# Patient Record
Sex: Female | Born: 1980 | Race: Asian | Hispanic: No | Marital: Married | Smoking: Former smoker
Health system: Southern US, Community
[De-identification: ages and names within clinical notes are randomized; demographics above are authoritative.]

## PROBLEM LIST (undated history)

## (undated) DIAGNOSIS — H6092 Unspecified otitis externa, left ear: Secondary | ICD-10-CM

## (undated) DIAGNOSIS — F418 Other specified anxiety disorders: Secondary | ICD-10-CM

## (undated) DIAGNOSIS — F32A Depression, unspecified: Secondary | ICD-10-CM

## (undated) DIAGNOSIS — H9319 Tinnitus, unspecified ear: Secondary | ICD-10-CM

## (undated) DIAGNOSIS — N6019 Diffuse cystic mastopathy of unspecified breast: Secondary | ICD-10-CM

## (undated) DIAGNOSIS — Z0000 Encounter for general adult medical examination without abnormal findings: Secondary | ICD-10-CM

## (undated) DIAGNOSIS — M25562 Pain in left knee: Secondary | ICD-10-CM

## (undated) DIAGNOSIS — F329 Major depressive disorder, single episode, unspecified: Secondary | ICD-10-CM

## (undated) DIAGNOSIS — R51 Headache: Secondary | ICD-10-CM

## (undated) DIAGNOSIS — Z9189 Other specified personal risk factors, not elsewhere classified: Secondary | ICD-10-CM

## (undated) DIAGNOSIS — B09 Unspecified viral infection characterized by skin and mucous membrane lesions: Principal | ICD-10-CM

## (undated) HISTORY — PX: APPENDECTOMY: SHX54

## (undated) HISTORY — DX: Other specified personal risk factors, not elsewhere classified: Z91.89

## (undated) HISTORY — PX: BREAST SURGERY: SHX581

## (undated) HISTORY — DX: Major depressive disorder, single episode, unspecified: F32.9

## (undated) HISTORY — DX: Other specified anxiety disorders: F41.8

## (undated) HISTORY — DX: Tinnitus, unspecified ear: H93.19

## (undated) HISTORY — DX: Pain in left knee: M25.562

## (undated) HISTORY — DX: Diffuse cystic mastopathy of unspecified breast: N60.19

## (undated) HISTORY — DX: Depression, unspecified: F32.A

## (undated) HISTORY — DX: Unspecified viral infection characterized by skin and mucous membrane lesions: B09

## (undated) HISTORY — DX: Unspecified otitis externa, left ear: H60.92

## (undated) HISTORY — DX: Encounter for general adult medical examination without abnormal findings: Z00.00

## (undated) HISTORY — DX: Headache: R51

---

## 2007-01-07 LAB — HM MAMMOGRAPHY

## 2007-09-01 ENCOUNTER — Emergency Department (HOSPITAL_COMMUNITY): Admission: EM | Admit: 2007-09-01 | Discharge: 2007-09-01 | Payer: Self-pay | Admitting: Emergency Medicine

## 2008-02-03 ENCOUNTER — Encounter: Payer: Self-pay | Admitting: Obstetrics and Gynecology

## 2008-02-03 ENCOUNTER — Ambulatory Visit: Payer: Self-pay | Admitting: Obstetrics and Gynecology

## 2008-02-04 ENCOUNTER — Encounter: Payer: Self-pay | Admitting: Obstetrics and Gynecology

## 2008-05-22 ENCOUNTER — Encounter: Admission: RE | Admit: 2008-05-22 | Discharge: 2008-05-22 | Payer: Self-pay | Admitting: Family Medicine

## 2009-01-06 LAB — HM PAP SMEAR

## 2010-04-22 LAB — POCT URINALYSIS DIP (DEVICE)
Bilirubin Urine: NEGATIVE
Glucose, UA: NEGATIVE mg/dL
Ketones, ur: NEGATIVE mg/dL
Specific Gravity, Urine: 1.015 (ref 1.005–1.030)
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
## 2011-03-12 ENCOUNTER — Emergency Department (HOSPITAL_COMMUNITY): Payer: PRIVATE HEALTH INSURANCE

## 2011-03-12 ENCOUNTER — Encounter (HOSPITAL_COMMUNITY): Payer: Self-pay | Admitting: *Deleted

## 2011-03-12 ENCOUNTER — Emergency Department (HOSPITAL_COMMUNITY)
Admission: EM | Admit: 2011-03-12 | Discharge: 2011-03-12 | Disposition: A | Discharge status: Home / Self Care | Payer: PRIVATE HEALTH INSURANCE | Attending: Emergency Medicine | Admitting: Emergency Medicine

## 2011-03-12 DIAGNOSIS — M79609 Pain in unspecified limb: Secondary | ICD-10-CM | POA: Insufficient documentation

## 2011-03-12 DIAGNOSIS — T148XXA Other injury of unspecified body region, initial encounter: Secondary | ICD-10-CM | POA: Insufficient documentation

## 2011-03-12 DIAGNOSIS — M25569 Pain in unspecified knee: Secondary | ICD-10-CM | POA: Insufficient documentation

## 2011-03-12 DIAGNOSIS — R51 Headache: Secondary | ICD-10-CM | POA: Insufficient documentation

## 2011-03-12 DIAGNOSIS — M542 Cervicalgia: Secondary | ICD-10-CM | POA: Insufficient documentation

## 2011-03-12 DIAGNOSIS — M546 Pain in thoracic spine: Secondary | ICD-10-CM | POA: Insufficient documentation

## 2011-03-12 DIAGNOSIS — R109 Unspecified abdominal pain: Secondary | ICD-10-CM | POA: Insufficient documentation

## 2011-03-12 LAB — POCT I-STAT, CHEM 8
BUN: 13 mg/dL (ref 6–23)
Creatinine, Ser: 0.6 mg/dL (ref 0.50–1.10)
Glucose, Bld: 107 mg/dL — ABNORMAL HIGH (ref 70–99)
Potassium: 4.4 mEq/L (ref 3.5–5.1)
Sodium: 138 mEq/L (ref 135–145)

## 2011-03-12 LAB — COMPREHENSIVE METABOLIC PANEL
ALT: 31 U/L (ref 0–35)
AST: 26 U/L (ref 0–37)
Alkaline Phosphatase: 47 U/L (ref 39–117)
CO2: 24 mEq/L (ref 19–32)
Calcium: 10.1 mg/dL (ref 8.4–10.5)
GFR calc Af Amer: >90 mL/min (ref >90)
GFR calc non Af Amer: >90 mL/min (ref >90)
Glucose, Bld: 103 mg/dL — ABNORMAL HIGH (ref 70–99)
Potassium: 4.3 mEq/L (ref 3.5–5.1)
Sodium: 136 mEq/L (ref 135–145)
Total Protein: 7.7 g/dL (ref 6.0–8.3)

## 2011-03-12 LAB — CBC
HCT: 40.4 % (ref 36.0–46.0)
Hemoglobin: 14 g/dL (ref 12.0–15.0)
MCH: 29.9 pg (ref 26.0–34.0)
MCHC: 34.7 g/dL (ref 30.0–36.0)
RDW: 13 % (ref 11.5–15.5)

## 2011-03-12 LAB — PROTIME-INR: INR: 0.92 (ref 0.00–1.49)

## 2011-03-12 MED ORDER — NAPROXEN 500 MG PO TABS: 500.0000 mg | ORAL_TABLET | Freq: Two times a day (BID) | ORAL | Status: DC

## 2011-03-12 MED ORDER — ONDANSETRON HCL 4 MG/2ML IJ SOLN
4.0000 mg | Freq: Once | INTRAMUSCULAR | Status: AC
  Administered 2011-03-12: 4 mg via INTRAVENOUS
  Filled 2011-03-12: qty 2

## 2011-03-12 MED ORDER — MORPHINE SULFATE 4 MG/ML IJ SOLN
4.0000 mg | Freq: Once | INTRAMUSCULAR | Status: AC
  Administered 2011-03-12: 4 mg via INTRAVENOUS
  Filled 2011-03-12: qty 1

## 2011-03-12 MED ORDER — ONDANSETRON 8 MG PO TBDP: 8.0000 mg | ORAL_TABLET | Freq: Three times a day (TID) | ORAL | Status: AC | PRN

## 2011-03-12 MED ORDER — HYDROCODONE-ACETAMINOPHEN 5-325 MG PO TABS: 1.0000 | ORAL_TABLET | Freq: Four times a day (QID) | ORAL | Status: AC | PRN

## 2011-03-12 MED ORDER — IOHEXOL 300 MG/ML  SOLN
80.0000 mL | Freq: Once | INTRAMUSCULAR | Status: AC | PRN
  Administered 2011-03-12: 80 mL via INTRAVENOUS

## 2011-03-12 MED ORDER — METOCLOPRAMIDE HCL 5 MG/ML IJ SOLN
10.0000 mg | Freq: Once | INTRAMUSCULAR | Status: AC
  Administered 2011-03-12: 10 mg via INTRAVENOUS
  Filled 2011-03-12: qty 2

## 2011-03-12 MED ORDER — CYCLOBENZAPRINE HCL 5 MG PO TABS: 5.0000 mg | ORAL_TABLET | Freq: Three times a day (TID) | ORAL | Status: AC | PRN
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
## 2011-03-12 NOTE — Progress Notes (Signed)
Orthopedic Tech Progress Note Patient Details:  Marcia Foley 15-Apr-1980 [id number]  Other Ortho Devices Type of Ortho Device: Crutches Ortho Device Interventions: Application   Marcia Foley 03/12/2011, 1:08 PM

## 2011-03-12 NOTE — ED Notes (Signed)
Patient transported to X-ray 
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
## 2011-03-12 NOTE — ED Notes (Signed)
Pt stated she was in pain again and very nauseous and needed alcohol on her nose. Gave pt alcohol pad per her request and pt grasped pad in fingers. Pt then stated "I can't move my arms." Held pt arm up and pt kept arm in air, no loss of upper extremity function noted. Pt informed and then closed eyes very tightly and refused to speak to this RN. MD notified of pt nausea and pain.

## 2011-05-16 ENCOUNTER — Encounter (HOSPITAL_COMMUNITY): Payer: Self-pay | Admitting: Emergency Medicine

## 2011-05-16 ENCOUNTER — Inpatient Hospital Stay (HOSPITAL_COMMUNITY)
Admission: EM | Admit: 2011-05-16 | Discharge: 2011-05-18 | DRG: 918 | Disposition: A | Discharge status: Home / Self Care | Payer: No Typology Code available for payment source | Source: Ambulatory Visit | Attending: Surgery | Admitting: Surgery

## 2011-05-16 DIAGNOSIS — Y998 Other external cause status: Secondary | ICD-10-CM

## 2011-05-16 DIAGNOSIS — T63121A Toxic effect of venom of other venomous lizard, accidental (unintentional), initial encounter: Secondary | ICD-10-CM | POA: Diagnosis present

## 2011-05-16 DIAGNOSIS — T63004A Toxic effect of unspecified snake venom, undetermined, initial encounter: Secondary | ICD-10-CM

## 2011-05-16 DIAGNOSIS — T63001A Toxic effect of unspecified snake venom, accidental (unintentional), initial encounter: Secondary | ICD-10-CM | POA: Diagnosis present

## 2011-05-16 DIAGNOSIS — T6391XA Toxic effect of contact with unspecified venomous animal, accidental (unintentional), initial encounter: Principal | ICD-10-CM | POA: Diagnosis present

## 2011-05-16 LAB — CBC
HCT: 33.4 % — ABNORMAL LOW (ref 36.0–46.0)
Hemoglobin: 11.3 g/dL — ABNORMAL LOW (ref 12.0–15.0)
MCH: 29.6 pg (ref 26.0–34.0)
MCHC: 33.8 g/dL (ref 30.0–36.0)
MCV: 87.4 fL (ref 78.0–100.0)

## 2011-05-16 LAB — BASIC METABOLIC PANEL
BUN: 15 mg/dL (ref 6–23)
CO2: 23 mEq/L (ref 19–32)
Chloride: 107 mEq/L (ref 96–112)
Creatinine, Ser: 0.66 mg/dL (ref 0.50–1.10)
Glucose, Bld: 100 mg/dL — ABNORMAL HIGH (ref 70–99)

## 2011-05-16 MED ORDER — SODIUM CHLORIDE 0.9 % IV SOLN
4.0000 | Freq: Once | INTRAVENOUS | Status: AC
  Administered 2011-05-17: 40 mL via INTRAVENOUS
  Filled 2011-05-16: qty 40

## 2011-05-16 MED ORDER — KETOROLAC TROMETHAMINE 30 MG/ML IJ SOLN
30.0000 mg | Freq: Once | INTRAMUSCULAR | Status: DC
  Administered 2011-05-16: 30 mg via INTRAVENOUS
  Filled 2011-05-16: qty 1

## 2011-05-16 MED ORDER — TETANUS-DIPHTH-ACELL PERTUSSIS 5-2.5-18.5 LF-MCG/0.5 IM SUSP
0.5000 mL | Freq: Once | INTRAMUSCULAR | Status: AC
  Administered 2011-05-16: 0.5 mL via INTRAMUSCULAR
  Filled 2011-05-16: qty 0.5

## 2011-05-16 MED ORDER — ONDANSETRON HCL 4 MG/2ML IJ SOLN
4.0000 mg | Freq: Once | INTRAMUSCULAR | Status: AC
  Administered 2011-05-16: 4 mg via INTRAVENOUS
  Filled 2011-05-16: qty 2

## 2011-05-16 MED ORDER — CROTALIDAE POLYVAL IMMUNE FAB IV SOLR
4.0000 | Freq: Once | INTRAVENOUS | Status: DC
  Filled 2011-05-16: qty 40

## 2011-05-16 MED ORDER — HYDROMORPHONE HCL PF 1 MG/ML IJ SOLN
1.0000 mg | Freq: Once | INTRAMUSCULAR | Status: AC
  Administered 2011-05-16: 1 mg via INTRAVENOUS
  Filled 2011-05-16: qty 1

## 2011-05-16 MED ORDER — SODIUM CHLORIDE 0.9 % IV BOLUS (SEPSIS)
1000.0000 mL | Freq: Once | INTRAVENOUS | Status: AC
  Administered 2011-05-16: 1000 mL via INTRAVENOUS

## 2011-05-16 NOTE — ED Provider Notes (Signed)
31 year old female was bitten on the right foot by a snake. She killed the snake and brought it in with her. It does appear to be a small copperhead. She is complaining of pain and swelling in the right foot and some nausea. Exam shows some ecchymosis and very slight swelling around 2 small puncture wounds on the lateral aspect of the right midfoot. At this time, she does not show any signs of systemic reaction and does not have any indications for using CroFab.  Dione Booze, MD 05/16/11 2221

## 2011-05-16 NOTE — ED Notes (Signed)
PHARMACY NOTIFIED BY RN TO SEND PT'S CROFAB ORDER.
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
## 2011-05-17 ENCOUNTER — Encounter (HOSPITAL_COMMUNITY): Payer: Self-pay | Admitting: Surgery

## 2011-05-17 DIAGNOSIS — T6391XA Toxic effect of contact with unspecified venomous animal, accidental (unintentional), initial encounter: Secondary | ICD-10-CM

## 2011-05-17 LAB — COMPREHENSIVE METABOLIC PANEL
Alkaline Phosphatase: 51 U/L (ref 39–117)
BUN: 12 mg/dL (ref 6–23)
CO2: 19 mEq/L (ref 19–32)
Chloride: 104 mEq/L (ref 96–112)
Creatinine, Ser: 0.58 mg/dL (ref 0.50–1.10)
GFR calc non Af Amer: >90 mL/min (ref >90)
Total Bilirubin: 0.3 mg/dL (ref 0.3–1.2)

## 2011-05-17 LAB — APTT: aPTT: 24 seconds (ref 24–37)

## 2011-05-17 LAB — CBC
MCH: 29.4 pg (ref 26.0–34.0)
Platelets: 361 10*3/uL (ref 150–400)
RBC: 4.25 MIL/uL (ref 3.87–5.11)
RDW: 12.9 % (ref 11.5–15.5)
WBC: 13.1 10*3/uL — ABNORMAL HIGH (ref 4.0–10.5)

## 2011-05-17 LAB — MRSA PCR SCREENING: MRSA by PCR: NEGATIVE

## 2011-05-17 LAB — PROTIME-INR: INR: 0.93 (ref 0.00–1.49)

## 2011-05-17 MED ORDER — ENOXAPARIN SODIUM 40 MG/0.4ML ~~LOC~~ SOLN
40.0000 mg | SUBCUTANEOUS | Status: DC
  Administered 2011-05-18: 40 mg via SUBCUTANEOUS
  Filled 2011-05-17: qty 0.4

## 2011-05-17 MED ORDER — LORAZEPAM 2 MG/ML IJ SOLN: 1.0000 mg | Freq: Four times a day (QID) | INTRAMUSCULAR | Status: DC | PRN

## 2011-05-17 MED ORDER — DEXTROSE IN LACTATED RINGERS 5 % IV SOLN
INTRAVENOUS | Status: DC
  Administered 2011-05-17 (×2): via INTRAVENOUS

## 2011-05-17 MED ORDER — OXYCODONE HCL 5 MG PO TABS
5.0000 mg | ORAL_TABLET | ORAL | Status: DC | PRN
  Administered 2011-05-17 – 2011-05-18 (×4): 5 mg via ORAL
  Filled 2011-05-17 (×4): qty 1

## 2011-05-17 MED ORDER — ONDANSETRON HCL 4 MG/2ML IJ SOLN
4.0000 mg | Freq: Four times a day (QID) | INTRAMUSCULAR | Status: DC | PRN
  Administered 2011-05-17 (×2): 4 mg via INTRAVENOUS
  Filled 2011-05-17 (×2): qty 2

## 2011-05-17 MED ORDER — HYDROMORPHONE HCL PF 1 MG/ML IJ SOLN
1.0000 mg | INTRAMUSCULAR | Status: DC | PRN
  Administered 2011-05-17 (×3): 1 mg via INTRAVENOUS
  Filled 2011-05-17 (×3): qty 1

## 2011-05-17 MED ORDER — PROMETHAZINE HCL 25 MG/ML IJ SOLN: 12.5000 mg | Freq: Four times a day (QID) | INTRAMUSCULAR | Status: DC | PRN

## 2011-05-17 NOTE — ED Notes (Signed)
CALLED FLOOR TO GIVE REPORT NURSE NOT READY AT THIS TIME.

## 2011-05-17 NOTE — Progress Notes (Signed)
Trauma Service Note  Subjective: Patient complaining of more swelling, more pain, nausea, and dizziness.  The nausea and dizziness may be secondary to the Crofab injection.  By report she has received just mone anti-venom  Objective: Vital signs in last 24 hours: Temp:  [97.7 F (36.5 C)-98.4 F (36.9 C)] 97.7 F (36.5 C) (05/11 1145) Pulse Rate:  [77-132] 77  (05/11 1145) Resp:  [8-20] 12  (05/11 1145) BP: (105-152)/(1-91) 117/79 mmHg (05/11 1145) SpO2:  [99 %-100 %] 100 % (05/11 1145) Weight:  [60.782 kg (134 lb)] 60.782 kg (134 lb) (05/10 2212) Last BM Date: 05/17/11  Intake/Output from previous day: 05/10 0701 - 05/11 0700 In: 600 [I.V.:600] Out: 5 [Urine:4; Stool:1] Intake/Output this shift: Total I/O In: 600 [I.V.:600] Out: 6 [Urine:4; Emesis/NG output:2]  General: Not acute distress  Lungs: Clear  Abd: Benign  Extremities: Ecchymotic swell laterally at the right ankle, just onto the lower leg, soft, definite no compartment syndrome  Neuro: Intact.  Normal function of RLE  Lab Results: CBC   Basename 05/17/11 0420 05/16/11 2222  WBC 13.1* 6.9  HGB 12.5 11.3*  HCT 36.7 33.4*  PLT 361 323   BMET  Basename 05/17/11 0420 05/16/11 2222  NA 136 141  K 4.0 4.2  CL 104 107  CO2 19 23  GLUCOSE 112* 100*  BUN 12 15  CREATININE 0.58 0.66  CALCIUM 8.1* 8.4   PT/INR  Basename 05/17/11 0420 05/16/11 2222  LABPROT 12.7 12.4  INR 0.93 0.91   ABG No results found for this basename: PHART:2,PCO2:2,PO2:2,HCO3:2 in the last 72 hours  Studies/Results: No results found.  Anti-infectives: Anti-infectives    None      Assessment/Plan: s/p  Advance diet Transfer to floor. Keep at bedrest, keep leg elevated today, ambulate tomorrow.  LOS: 1 day   Marta Lamas. Gae Bon, MD, FACS [phone] Trauma Surgeon 05/17/2011

## 2011-05-17 NOTE — ED Notes (Signed)
PT. STILL WAITING FOR IV CROFAB MEDICATION FROM PHARMACY.

## 2011-05-17 NOTE — H&P (Signed)
Marcia Foley is an 31 y.o. female.   Chief Complaint: SNAKE BITE RIGHT ANKLE HPI: Asked to see pt at request of Dr Maisie Fus due to snake bite right ankle tonight. Snake positively identified as a copper head probably juvenile. She complains of nausea,  Vomiting,  diarrhea and ankle and abdominal pain.  History reviewed. No pertinent past medical history.  History reviewed. No pertinent past surgical history.  No family history on file. Social History:  reports that she has never smoked. She does not have any smokeless tobacco history on file. She reports that she does not drink alcohol or use illicit drugs.  Allergies:  Allergies  Allergen Reactions  . Other     Steroids = keeps her awake     (Not in a hospital admission)  Results for orders placed during the hospital encounter of 05/16/11 (from the past 48 hour(s))  CBC     Status: Abnormal   Collection Time   05/16/11 10:22 PM      Component Value Range Comment   WBC 6.9  4.0 - 10.5 (K/uL)    RBC 3.82 (*) 3.87 - 5.11 (MIL/uL)    Hemoglobin 11.3 (*) 12.0 - 15.0 (g/dL)    HCT 16.1 (*) 09.6 - 46.0 (%)    MCV 87.4  78.0 - 100.0 (fL)    MCH 29.6  26.0 - 34.0 (pg)    MCHC 33.8  30.0 - 36.0 (g/dL)    RDW 04.5  40.9 - 81.1 (%)    Platelets 323  150 - 400 (K/uL)   PROTIME-INR     Status: Normal   Collection Time   05/16/11 10:22 PM      Component Value Range Comment   Prothrombin Time 12.4  11.6 - 15.2 (seconds)    INR 0.91  0.00 - 1.49    BASIC METABOLIC PANEL     Status: Abnormal   Collection Time   05/16/11 10:22 PM      Component Value Range Comment   Sodium 141  135 - 145 (mEq/L)    Potassium 4.2  3.5 - 5.1 (mEq/L)    Chloride 107  96 - 112 (mEq/L)    CO2 23  19 - 32 (mEq/L)    Glucose, Bld 100 (*) 70 - 99 (mg/dL)    BUN 15  6 - 23 (mg/dL)    Creatinine, Ser 9.14  0.50 - 1.10 (mg/dL)    Calcium 8.4  8.4 - 10.5 (mg/dL)    GFR calc non Af Amer >90  >90 (mL/min)    GFR calc Af Amer >90  >90 (mL/min)   FIBRINOGEN      Status: Abnormal   Collection Time   05/16/11 10:22 PM      Component Value Range Comment   Fibrinogen 161 (*) 204 - 475 (mg/dL)    No results found.  Review of Systems  Constitutional: Positive for malaise/fatigue and diaphoresis.  HENT: Negative.   Eyes: Negative.   Respiratory: Negative.   Cardiovascular: Negative.   Gastrointestinal: Positive for nausea, vomiting, abdominal pain and diarrhea.  Genitourinary: Negative.   Musculoskeletal: Positive for myalgias.  Skin: Negative.   Neurological: Positive for weakness.  Endo/Heme/Allergies: Negative.   Psychiatric/Behavioral: Negative.     Blood pressure 132/91, pulse 112, temperature 98.4 F (36.9 C), temperature source Oral, resp. rate 16, weight 134 lb (60.782 kg), last menstrual period 05/14/2011, SpO2 100.00%. Physical Exam  Constitutional: She is oriented to person, place, and time. She appears well-developed and well-nourished. She  appears distressed.  HENT:  Head: Normocephalic and atraumatic.  Eyes: EOM are normal. Pupils are equal, round, and reactive to light.  Neck: Normal range of motion. Neck supple.  Cardiovascular: Normal rate and regular rhythm.  Exam reveals no gallop and no friction rub.   No murmur heard. Respiratory: Effort normal and breath sounds normal.  GI: Soft. Bowel sounds are normal. There is tenderness.  Musculoskeletal: Normal range of motion.  Neurological: She is alert and oriented to person, place, and time.  Skin:        Assessment/Plan Snake bite right ankle by verified cooper head with systemic sign of envenomation Cro FAB ordered by EDP which is reasonable since she is symptomatic Step down bed. Monitor coag and fibrinogen Treat symptoms. monitor  Senai Kingsley A. 05/17/2011, 12:11 AM

## 2011-05-17 NOTE — ED Notes (Signed)
STILL WAITING FOR PT'S. CROFAB MEDICATION IV FROM PHARMACY.

## 2011-05-18 MED ORDER — CEPHALEXIN 500 MG PO CAPS
500.0000 mg | ORAL_CAPSULE | Freq: Three times a day (TID) | ORAL | Status: DC
  Administered 2011-05-18: 500 mg via ORAL
  Filled 2011-05-18 (×3): qty 1

## 2011-05-18 MED ORDER — CEPHALEXIN 500 MG PO CAPS: 500.0000 mg | ORAL_CAPSULE | Freq: Three times a day (TID) | ORAL | Status: AC

## 2011-05-18 MED ORDER — OXYCODONE HCL 5 MG PO TABS: 5.0000 mg | ORAL_TABLET | Freq: Four times a day (QID) | ORAL | Status: DC | PRN

## 2011-05-18 NOTE — Progress Notes (Signed)
Discharge instructions gone over with patient. Prescription for pain given, other for antibiotic faxed to [facility] on [address]. Follow up with trauma doctor if needed. Home medications gone over. Told to be sure to complete antibiotics. Patient verbalized understanding of all. Copy of instructions given to patient.

## 2011-05-18 NOTE — Progress Notes (Signed)
  Subjective: Feels better.  Sore right ankle.  Objective: Vital signs in last 24 hours: Temp:  [97.4 F (36.3 C)-99.3 F (37.4 C)] 97.4 F (36.3 C) (05/12 1000) Pulse Rate:  [73-93] 83  (05/12 1000) Resp:  [12-18] 18  (05/12 1000) BP: (98-117)/(63-79) 110/69 mmHg (05/12 1000) SpO2:  [99 %-100 %] 100 % (05/12 1000) Last BM Date: 05/17/11  Intake/Output from previous day: 05/11 0701 - 05/12 0700 In: 1320 [P.O.:720; I.V.:600] Out: 300 [Urine:300] Intake/Output this shift: Total I/O In: 240 [P.O.:240] Out: -   General appearance: alert and cooperative Resp: clear to auscultation bilaterally GI: soft, non-tender; bowel sounds normal; no masses,  no organomegaly Extremities: edema to rle less.  soft compartments.  no redness or fluctuance or tissue loss and   Lab Results:   [facility] 05/17/11 0420 05/16/11 2222  WBC 13.1* 6.9  HGB 12.5 11.3*  HCT 36.7 33.4*  PLT 361 323   BMET  Basename 05/17/11 0420 05/16/11 2222  NA 136 141  K 4.0 4.2  CL 104 107  CO2 19 23  GLUCOSE 112* 100*  BUN 12 15  CREATININE 0.58 0.66  CALCIUM 8.1* 8.4   PT/INR  Basename 05/17/11 0420 05/16/11 2222  LABPROT 12.7 12.4  INR 0.93 0.91   ABG No results found for this basename: PHART:2,PCO2:2,PO2:2,HCO3:2 in the last 72 hours  Studies/Results: No results found.  Anti-infectives: Anti-infectives    None      Assessment/Plan: Snake bite with envenomation  Improved with no further progression Antibiotics for 7 days Follow up trauma clinic.  Discharge  LOS: 2 days    Corinthian Mizrahi A. 05/18/2011

## 2011-05-18 NOTE — Discharge Summary (Signed)
Physician Discharge Summary  Patient ID: Marcia Foley MRN: [id number] DOB/AGE: October 19, 1980 31 y.o.  Admit date: 05/16/2011 Discharge date: 05/18/2011  Admission Diagnoses:  Snake envenomation right ankle  Discharge Diagnoses: same Active Problems:  * No active hospital problems. *    Discharged Condition: good  Hospital Course: Pt admitted 2 days ago after being bitten by a copperhead juvenile snake.  She received cryofab antivenom due to systemic signs.  Admited and followed.  Pain better on day one and diet advanced.   Swelling better on day 2 and she had good pain control with improvement in systemic symptoms.  Vital signs stable.  Right leg less swollen and she had soft compartments and no redness or fluctuance.  She could ambulate with assistance.  She was discharge home at this point.  Labs showed slight increase in WBC but no fever or chills.  The remaining blood work clinically acceptable.   Consults: None  Significant Diagnostic Studies: labs: see lab section  Treatments: cryofab antivenom  Discharge Exam: Blood pressure 110/69, pulse 83, temperature 97.4 F (36.3 C), temperature source Oral, resp. rate 18, height 5\' 3"  (1.6 m), weight 134 lb (60.782 kg), last menstrual period 05/14/2011, SpO2 100.00%. General appearance: alert and cooperative Resp: clear to auscultation bilaterally GI: soft, non-tender; bowel sounds normal; no masses,  no organomegaly Extremities: edema right ankle swelling much imprived no redness and compartments soft.  Disposition: 01-Home or Self Care  Discharge Orders    Future Orders Please Complete By Expires   Diet - low sodium heart healthy      Increase activity slowly      Driving Restrictions      Comments:   When comfortable.  Off pain meds.   Lifting restrictions      Comments:   As tolerated.   Discharge instructions      Comments:   Call [phone] [facility] for follow up with the Trauma clinic in 7 - 10 days.  OK to  use crutches as needed.  Keflex for 7 days.  Ice packs as needed.     Medication List  As of 05/18/2011 11:05 AM   TAKE these medications         aspirin 325 MG tablet   Take 325 mg by mouth every 4 (four) hours as needed. For pain      cephALEXin 500 MG capsule   Commonly known as: KEFLEX   Take 1 capsule (500 mg total) by mouth every 8 (eight) hours.      ibuprofen 200 MG tablet   Commonly known as: ADVIL,MOTRIN   Take 400 mg by mouth every 6 (six) hours as needed. For pain      oxyCODONE 5 MG immediate release tablet   Commonly known as: Oxy IR/ROXICODONE   Take 1 tablet (5 mg total) by mouth every 6 (six) hours as needed.             Signed: Jayanth Szczesniak A. 05/18/2011, 11:05 AM

## 2011-05-18 NOTE — ED Provider Notes (Signed)
I saw and evaluated the patient, reviewed the resident's note and I agree with the findings and plan. CRITICAL CARE Performed by: Dione Booze   Total critical care time: 40 minutes  Critical care time was exclusive of separately billable procedures and treating other patients.  Critical care was necessary to treat or prevent imminent or life-threatening deterioration.  Critical care was time spent personally by me on the following activities: development of treatment plan with patient and/or surrogate as well as nursing, discussions with consultants, evaluation of patient's response to treatment, examination of patient, obtaining history from patient or surrogate, ordering and performing treatments and interventions, ordering and review of laboratory studies, ordering and review of radiographic studies, pulse oximetry and re-evaluation of patient's condition.   Dione Booze, MD 05/18/11 [phone]

## 2011-05-19 NOTE — Progress Notes (Signed)
UR complete 

## 2011-05-21 ENCOUNTER — Encounter (HOSPITAL_COMMUNITY): Payer: Self-pay | Admitting: *Deleted

## 2011-05-21 ENCOUNTER — Emergency Department (HOSPITAL_COMMUNITY)
Admission: EM | Admit: 2011-05-21 | Discharge: 2011-05-21 | Discharge status: Against Medical Advice | Payer: PRIVATE HEALTH INSURANCE | Attending: Emergency Medicine | Admitting: Emergency Medicine

## 2011-05-21 DIAGNOSIS — R209 Unspecified disturbances of skin sensation: Secondary | ICD-10-CM | POA: Insufficient documentation

## 2011-05-21 DIAGNOSIS — R42 Dizziness and giddiness: Secondary | ICD-10-CM | POA: Insufficient documentation

## 2011-05-21 DIAGNOSIS — R110 Nausea: Secondary | ICD-10-CM | POA: Insufficient documentation

## 2011-05-21 NOTE — ED Notes (Signed)
Pt was d/c'd from [facility] on Sunday after tx for copper head bite.  When pt was discharged, she felt nauseated, dizzy and numb at the site, but today these symptoms became unbearable. AO x 4.

## 2011-06-24 ENCOUNTER — Ambulatory Visit (INDEPENDENT_AMBULATORY_CARE_PROVIDER_SITE_OTHER): Payer: PRIVATE HEALTH INSURANCE | Admitting: Family Medicine

## 2011-06-24 ENCOUNTER — Encounter: Payer: Self-pay | Admitting: Family Medicine

## 2011-06-24 VITALS — BP 129/90 | HR 97 | Temp 98.4°F | Ht 63.0 in | Wt 141.4 lb

## 2011-06-24 DIAGNOSIS — F418 Other specified anxiety disorders: Secondary | ICD-10-CM | POA: Insufficient documentation

## 2011-06-24 DIAGNOSIS — R519 Headache, unspecified: Secondary | ICD-10-CM | POA: Insufficient documentation

## 2011-06-24 DIAGNOSIS — R51 Headache: Secondary | ICD-10-CM

## 2011-06-24 DIAGNOSIS — H9319 Tinnitus, unspecified ear: Secondary | ICD-10-CM

## 2011-06-24 DIAGNOSIS — M25569 Pain in unspecified knee: Secondary | ICD-10-CM

## 2011-06-24 DIAGNOSIS — Z9189 Other specified personal risk factors, not elsewhere classified: Secondary | ICD-10-CM

## 2011-06-24 DIAGNOSIS — H6092 Unspecified otitis externa, left ear: Secondary | ICD-10-CM | POA: Insufficient documentation

## 2011-06-24 DIAGNOSIS — F341 Dysthymic disorder: Secondary | ICD-10-CM

## 2011-06-24 DIAGNOSIS — F411 Generalized anxiety disorder: Secondary | ICD-10-CM

## 2011-06-24 DIAGNOSIS — M25562 Pain in left knee: Secondary | ICD-10-CM

## 2011-06-24 DIAGNOSIS — F419 Anxiety disorder, unspecified: Secondary | ICD-10-CM

## 2011-06-24 DIAGNOSIS — N6019 Diffuse cystic mastopathy of unspecified breast: Secondary | ICD-10-CM

## 2011-06-24 HISTORY — DX: Pain in left knee: M25.562

## 2011-06-24 HISTORY — DX: Other specified personal risk factors, not elsewhere classified: Z91.89

## 2011-06-24 MED ORDER — ALPRAZOLAM 0.25 MG PO TABS: 0.2500 mg | ORAL_TABLET | Freq: Two times a day (BID) | ORAL | Status: AC | PRN
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
## 2011-06-24 NOTE — Assessment & Plan Note (Signed)
Cannot discriminate l vs r. Will monitor has only started since her snake bite and increased stressors

## 2011-06-24 NOTE — Assessment & Plan Note (Signed)
No c/o today 

## 2011-06-24 NOTE — Assessment & Plan Note (Signed)
She has taken Alprazolam in the past during a bout of PPD. She feels it worked well and she did not need it frequently. She is given a small amount to use as needed and warned about the risk of addiction and sedation. She is to keep track of her use and she is asked to consider a daily SSRI on return if she is not feeling any better.

## 2011-06-24 NOTE — Assessment & Plan Note (Signed)
Describing tension headaches. Does not drink fluids well, encouraged 64 oz of clear fluids daily, increased exercise, small, frequent healthy meals and reassess at next visit.

## 2011-06-24 NOTE — Assessment & Plan Note (Signed)
Was bit 3 times by a young copperhead snake on her right foot. Spent several days in the hospital and feels she has mostly recovered.

## 2011-06-24 NOTE — Assessment & Plan Note (Signed)
Left knee pain is her only persistent symptom of note. Encouraged her to start [facility] oil caps daily and use Aspercreme prn.

## 2011-06-24 NOTE — Patient Instructions (Addendum)
Preventive Care for Adults, Female A healthy lifestyle and preventive care can promote health and wellness. Preventive health guidelines for women include the following key practices.  A routine yearly physical is a good way to check with your caregiver about your health and preventive screening. It is a chance to share any concerns and updates on your health, and to receive a thorough exam.   Visit your dentist for a routine exam and preventive care every 6 months. Brush your teeth twice a day and floss once a day. Good oral hygiene prevents tooth decay and gum disease.   The frequency of eye exams is based on your age, health, family medical history, use of contact lenses, and other factors. Follow your caregiver's recommendations for frequency of eye exams.   Eat a healthy diet. Foods like vegetables, fruits, whole grains, low-fat dairy products, and lean protein foods contain the nutrients you need without too many calories. Decrease your intake of foods high in solid fats, added sugars, and salt. Eat the right amount of calories for you.Get information about a proper diet from your caregiver, if necessary.   Regular physical exercise is one of the most important things you can do for your health. Most adults should get at least 150 minutes of moderate-intensity exercise (any activity that increases your heart rate and causes you to sweat) each week. In addition, most adults need muscle-strengthening exercises on 2 or more days a week.   Maintain a healthy weight. The body mass index (BMI) is a screening tool to identify possible weight problems. It provides an estimate of body fat based on height and weight. Your caregiver can help determine your BMI, and can help you achieve or maintain a healthy weight.For adults 20 years and older:   A BMI below 18.5 is considered underweight.   A BMI of 18.5 to 24.9 is normal.   A BMI of 25 to 29.9 is considered overweight.   A BMI of 30 and above is  considered obese.   Maintain normal blood lipids and cholesterol levels by exercising and minimizing your intake of saturated fat. Eat a balanced diet with plenty of fruit and vegetables. Blood tests for lipids and cholesterol should begin at age 20 and be repeated every 5 years. If your lipid or cholesterol levels are high, you are over 50, or you are at high risk for heart disease, you may need your cholesterol levels checked more frequently.Ongoing high lipid and cholesterol levels should be treated with medicines if diet and exercise are not effective.   If you smoke, find out from your caregiver how to quit. If you do not use tobacco, do not start.   If you are pregnant, do not drink alcohol. If you are breastfeeding, be very cautious about drinking alcohol. If you are not pregnant and choose to drink alcohol, do not exceed 1 drink per day. One drink is considered to be 12 ounces (355 mL) of beer, 5 ounces (148 mL) of wine, or 1.5 ounces (44 mL) of liquor.   Avoid use of street drugs. Do not share needles with anyone. Ask for help if you need support or instructions about stopping the use of drugs.   High blood pressure causes heart disease and increases the risk of stroke. Your blood pressure should be checked at least every 1 to 2 years. Ongoing high blood pressure should be treated with medicines if weight loss and exercise are not effective.   If you are 55 to 33   years old, ask your caregiver if you should take aspirin to prevent strokes.   Diabetes screening involves taking a blood sample to check your fasting blood sugar level. This should be done once every 3 years, after age 45, if you are within normal weight and without risk factors for diabetes. Testing should be considered at a younger age or be carried out more frequently if you are overweight and have at least 1 risk factor for diabetes.   Breast cancer screening is essential preventive care for women. You should practice "breast  self-awareness." This means understanding the normal appearance and feel of your breasts and may include breast self-examination. Any changes detected, no matter how small, should be reported to a caregiver. Women in their 20s and 30s should have a clinical breast exam (CBE) by a caregiver as part of a regular health exam every 1 to 3 years. After age 40, women should have a CBE every year. Starting at age 40, women should consider having a mammography (breast X-ray test) every year. Women who have a family history of breast cancer should talk to their caregiver about genetic screening. Women at a high risk of breast cancer should talk to their caregivers about having magnetic resonance imaging (MRI) and a mammography every year.   The Pap test is a screening test for cervical cancer. A Pap test can show cell changes on the cervix that might become cervical cancer if left untreated. A Pap test is a procedure in which cells are obtained and examined from the lower end of the uterus (cervix).   Women should have a Pap test starting at age 21.   Between ages 21 and 29, Pap tests should be repeated every 2 years.   Beginning at age 30, you should have a Pap test every 3 years as long as the past 3 Pap tests have been normal.   Some women have medical problems that increase the chance of getting cervical cancer. Talk to your caregiver about these problems. It is especially important to talk to your caregiver if a new problem develops soon after your last Pap test. In these cases, your caregiver may recommend more frequent screening and Pap tests.   The above recommendations are the same for women who have or have not gotten the vaccine for human papillomavirus (HPV).   If you had a hysterectomy for a problem that was not cancer or a condition that could lead to cancer, then you no longer need Pap tests. Even if you no longer need a Pap test, a regular exam is a good idea to make sure no other problems are  starting.   If you are between ages 65 and 70, and you have had normal Pap tests going back 10 years, you no longer need Pap tests. Even if you no longer need a Pap test, a regular exam is a good idea to make sure no other problems are starting.   If you have had past treatment for cervical cancer or a condition that could lead to cancer, you need Pap tests and screening for cancer for at least 20 years after your treatment.   If Pap tests have been discontinued, risk factors (such as a new sexual partner) need to be reassessed to determine if screening should be resumed.   The HPV test is an additional test that may be used for cervical cancer screening. The HPV test looks for the virus that can cause the cell changes on the cervix.   The cells collected during the Pap test can be tested for HPV. The HPV test could be used to screen women aged 30 years and older, and should be used in women of any age who have unclear Pap test results. After the age of 30, women should have HPV testing at the same frequency as a Pap test.   Colorectal cancer can be detected and often prevented. Most routine colorectal cancer screening begins at the age of 50 and continues through age 75. However, your caregiver may recommend screening at an earlier age if you have risk factors for colon cancer. On a yearly basis, your caregiver may provide home test kits to check for hidden blood in the stool. Use of a small camera at the end of a tube, to directly examine the colon (sigmoidoscopy or colonoscopy), can detect the earliest forms of colorectal cancer. Talk to your caregiver about this at age 50, when routine screening begins. Direct examination of the colon should be repeated every 5 to 10 years through age 75, unless early forms of pre-cancerous polyps or small growths are found.   Hepatitis C blood testing is recommended for all people born from 1945 through 1965 and any individual with known risks for hepatitis C.    Practice safe sex. Use condoms and avoid high-risk sexual practices to reduce the spread of sexually transmitted infections (STIs). STIs include gonorrhea, chlamydia, syphilis, trichomonas, herpes, HPV, and human immunodeficiency virus (HIV). Herpes, HIV, and HPV are viral illnesses that have no cure. They can result in disability, cancer, and death. Sexually active women aged 25 and younger should be checked for chlamydia. Older women with new or multiple partners should also be tested for chlamydia. Testing for other STIs is recommended if you are sexually active and at increased risk.   Osteoporosis is a disease in which the bones lose minerals and strength with aging. This can result in serious bone fractures. The risk of osteoporosis can be identified using a bone density scan. Women ages 65 and over and women at risk for fractures or osteoporosis should discuss screening with their caregivers. Ask your caregiver whether you should take a calcium supplement or vitamin D to reduce the rate of osteoporosis.   Menopause can be associated with physical symptoms and risks. Hormone replacement therapy is available to decrease symptoms and risks. You should talk to your caregiver about whether hormone replacement therapy is right for you.   Use sunscreen with sun protection factor (SPF) of 30 or more. Apply sunscreen liberally and repeatedly throughout the day. You should seek shade when your shadow is shorter than you. Protect yourself by wearing long sleeves, pants, a wide-brimmed hat, and sunglasses year round, whenever you are outdoors.   Once a month, do a whole body skin exam, using a mirror to look at the skin on your back. Notify your caregiver of new moles, moles that have irregular borders, moles that are larger than a pencil eraser, or moles that have changed in shape or color.   Stay current with required immunizations.   Influenza. You need a dose every fall (or winter). The composition of  the flu vaccine changes each year, so being vaccinated once is not enough.   Pneumococcal polysaccharide. You need 1 to 2 doses if you smoke cigarettes or if you have certain chronic medical conditions. You need 1 dose at age 65 (or older) if you have never been vaccinated.   Tetanus, diphtheria, pertussis (Tdap, Td). Get 1 dose of   Tdap vaccine if you are younger than age 65, are over 65 and have contact with an infant, are a healthcare worker, are pregnant, or simply want to be protected from whooping cough. After that, you need a Td booster dose every 10 years. Consult your caregiver if you have not had at least 3 tetanus and diphtheria-containing shots sometime in your life or have a deep or dirty wound.   HPV. You need this vaccine if you are a woman age 26 or younger. The vaccine is given in 3 doses over 6 months.   Measles, mumps, rubella (MMR). You need at least 1 dose of MMR if you were born in 1957 or later. You may also need a second dose.   Meningococcal. If you are age 19 to 21 and a first-year college student living in a residence hall, or have one of several medical conditions, you need to get vaccinated against meningococcal disease. You may also need additional booster doses.   Zoster (shingles). If you are age 60 or older, you should get this vaccine.   Varicella (chickenpox). If you have never had chickenpox or you were vaccinated but received only 1 dose, talk to your caregiver to find out if you need this vaccine.   Hepatitis A. You need this vaccine if you have a specific risk factor for hepatitis A virus infection or you simply wish to be protected from this disease. The vaccine is usually given as 2 doses, 6 to 18 months apart.   Hepatitis B. You need this vaccine if you have a specific risk factor for hepatitis B virus infection or you simply wish to be protected from this disease. The vaccine is given in 3 doses, usually over 6 months.  Preventive Services /  Frequency Ages 19 to 39  Blood pressure check.** / Every 1 to 2 years.   Lipid and cholesterol check.** / Every 5 years beginning at age 20.   Clinical breast exam.** / Every 3 years for women in their 20s and 30s.   Pap test.** / Every 2 years from ages 21 through 29. Every 3 years starting at age 30 through age 65 or 70 with a history of 3 consecutive normal Pap tests.   HPV screening.** / Every 3 years from ages 30 through ages 65 to 70 with a history of 3 consecutive normal Pap tests.   Hepatitis C blood test.** / For any individual with known risks for hepatitis C.   Skin self-exam. / Monthly.   Influenza immunization.** / Every year.   Pneumococcal polysaccharide immunization.** / 1 to 2 doses if you smoke cigarettes or if you have certain chronic medical conditions.   Tetanus, diphtheria, pertussis (Tdap, Td) immunization. / A one-time dose of Tdap vaccine. After that, you need a Td booster dose every 10 years.   HPV immunization. / 3 doses over 6 months, if you are 26 and younger.   Measles, mumps, rubella (MMR) immunization. / You need at least 1 dose of MMR if you were born in 1957 or later. You may also need a second dose.   Meningococcal immunization. / 1 dose if you are age 19 to 21 and a first-year college student living in a residence hall, or have one of several medical conditions, you need to get vaccinated against meningococcal disease. You may also need additional booster doses.   Varicella immunization.** / Consult your caregiver.   Hepatitis A immunization.** / Consult your caregiver. 2 doses, 6 to 18 months   apart.   Hepatitis B immunization.** / Consult your caregiver. 3 doses usually over 6 months.  Ages 40 to 64  Blood pressure check.** / Every 1 to 2 years.   Lipid and cholesterol check.** / Every 5 years beginning at age 20.   Clinical breast exam.** / Every year after age 40.   Mammogram.** / Every year beginning at age 40 and continuing for as  long as you are in good health. Consult with your caregiver.   Pap test.** / Every 3 years starting at age 30 through age 65 or 70 with a history of 3 consecutive normal Pap tests.   HPV screening.** / Every 3 years from ages 30 through ages 65 to 70 with a history of 3 consecutive normal Pap tests.   Fecal occult blood test (FOBT) of stool. / Every year beginning at age 50 and continuing until age 75. You may not need to do this test if you get a colonoscopy every 10 years.   Flexible sigmoidoscopy or colonoscopy.** / Every 5 years for a flexible sigmoidoscopy or every 10 years for a colonoscopy beginning at age 50 and continuing until age 75.   Hepatitis C blood test.** / For all people born from 1945 through 1965 and any individual with known risks for hepatitis C.   Skin self-exam. / Monthly.   Influenza immunization.** / Every year.   Pneumococcal polysaccharide immunization.** / 1 to 2 doses if you smoke cigarettes or if you have certain chronic medical conditions.   Tetanus, diphtheria, pertussis (Tdap, Td) immunization.** / A one-time dose of Tdap vaccine. After that, you need a Td booster dose every 10 years.   Measles, mumps, rubella (MMR) immunization. / You need at least 1 dose of MMR if you were born in 1957 or later. You may also need a second dose.   Varicella immunization.** / Consult your caregiver.   Meningococcal immunization.** / Consult your caregiver.   Hepatitis A immunization.** / Consult your caregiver. 2 doses, 6 to 18 months apart.   Hepatitis B immunization.** / Consult your caregiver. 3 doses, usually over 6 months.  Ages 65 and over  Blood pressure check.** / Every 1 to 2 years.   Lipid and cholesterol check.** / Every 5 years beginning at age 20.   Clinical breast exam.** / Every year after age 40.   Mammogram.** / Every year beginning at age 40 and continuing for as long as you are in good health. Consult with your caregiver.   Pap test.** /  Every 3 years starting at age 30 through age 65 or 70 with a 3 consecutive normal Pap tests. Testing can be stopped between 65 and 70 with 3 consecutive normal Pap tests and no abnormal Pap or HPV tests in the past 10 years.   HPV screening.** / Every 3 years from ages 30 through ages 65 or 70 with a history of 3 consecutive normal Pap tests. Testing can be stopped between 65 and 70 with 3 consecutive normal Pap tests and no abnormal Pap or HPV tests in the past 10 years.   Fecal occult blood test (FOBT) of stool. / Every year beginning at age 50 and continuing until age 75. You may not need to do this test if you get a colonoscopy every 10 years.   Flexible sigmoidoscopy or colonoscopy.** / Every 5 years for a flexible sigmoidoscopy or every 10 years for a colonoscopy beginning at age 50 and continuing until age 75.   Hepatitis   C blood test.** / For all people born from [birth year] through 1965 and any individual with known risks for hepatitis C.   Osteoporosis screening.** / A one-time screening for women ages 39 and over and women at risk for fractures or osteoporosis.   Skin self-exam. / Monthly.   Influenza immunization.** / Every year.   Pneumococcal polysaccharide immunization.** / 1 dose at age 29 (or older) if you have never been vaccinated.   Tetanus, diphtheria, pertussis (Tdap, Td) immunization. / A one-time dose of Tdap vaccine if you are over 65 and have contact with an infant, are a Research scientist (physical sciences), or simply want to be protected from whooping cough. After that, you need a Td booster dose every 10 years.   Varicella immunization.** / Consult your caregiver.   Meningococcal immunization.** / Consult your caregiver.   Hepatitis A immunization.** / Consult your caregiver. 2 doses, 6 to 18 months apart.   Hepatitis B immunization.** / Check with your caregiver. 3 doses, usually over 6 months.  ** Family history and personal history of risk and conditions may change your caregiver's  recommendations. Document Released: 02/18/2001 Document Revised: 12/12/2010 Document Reviewed: 05/20/2010 [facility] Patient Information 2012 [place], [place].   For headaches remember drink 64 oz of clear fluids daily, eat small, frequent meals with healthy proteins, take a walk every day

## 2011-06-30 ENCOUNTER — Telehealth: Payer: Self-pay

## 2011-06-30 MED ORDER — ESCITALOPRAM OXALATE 10 MG PO TABS: 10.0000 mg | ORAL_TABLET | Freq: Every day | ORAL | Status: DC

## 2011-06-30 NOTE — Telephone Encounter (Signed)
RX sent and pts spouse informed 

## 2011-06-30 NOTE — Telephone Encounter (Signed)
Pts spouse called stating pt takes Xanax for anxiety. Outside stresser is causing pt to have to take more Xanax. Pts spouse states that MD stated she would prescribe something else so pt wouldn't have to take as many Xanax? Please advise?

## 2011-06-30 NOTE — Telephone Encounter (Signed)
Have her start Escitalopram 10 mg po daily disp # 30 and she should come back to see me in 2 weeks. Can cause increase nausa and increased anxiety but at this low dose it is unlikely. If this is not tolerated she should come in for further evaluation sooner

## 2011-08-05 ENCOUNTER — Ambulatory Visit (INDEPENDENT_AMBULATORY_CARE_PROVIDER_SITE_OTHER): Payer: PRIVATE HEALTH INSURANCE | Admitting: Family Medicine

## 2011-08-05 ENCOUNTER — Encounter: Payer: Self-pay | Admitting: Family Medicine

## 2011-08-05 VITALS — BP 114/81 | HR 80 | Temp 98.3°F | Ht 63.0 in | Wt 143.8 lb

## 2011-08-05 DIAGNOSIS — Z0000 Encounter for general adult medical examination without abnormal findings: Secondary | ICD-10-CM

## 2011-08-05 DIAGNOSIS — H60399 Other infective otitis externa, unspecified ear: Secondary | ICD-10-CM

## 2011-08-05 DIAGNOSIS — F419 Anxiety disorder, unspecified: Secondary | ICD-10-CM

## 2011-08-05 DIAGNOSIS — F418 Other specified anxiety disorders: Secondary | ICD-10-CM

## 2011-08-05 DIAGNOSIS — R011 Cardiac murmur, unspecified: Secondary | ICD-10-CM

## 2011-08-05 DIAGNOSIS — H6090 Unspecified otitis externa, unspecified ear: Secondary | ICD-10-CM

## 2011-08-05 DIAGNOSIS — H6092 Unspecified otitis externa, left ear: Secondary | ICD-10-CM

## 2011-08-05 DIAGNOSIS — F411 Generalized anxiety disorder: Secondary | ICD-10-CM

## 2011-08-05 DIAGNOSIS — F341 Dysthymic disorder: Secondary | ICD-10-CM

## 2011-08-05 HISTORY — DX: Encounter for general adult medical examination without abnormal findings: Z00.00

## 2011-08-05 LAB — LIPID PANEL
HDL: 55.4 mg/dL (ref >39.00)
Total CHOL/HDL Ratio: 4
Triglycerides: 114 mg/dL (ref 0.0–149.0)

## 2011-08-05 LAB — RENAL FUNCTION PANEL
BUN: 12 mg/dL (ref 6–23)
Chloride: 103 mEq/L (ref 96–112)
GFR: 120.22 mL/min (ref >60.00)
Phosphorus: 3.1 mg/dL (ref 2.3–4.6)
Potassium: 4.3 mEq/L (ref 3.5–5.1)

## 2011-08-05 LAB — HEPATIC FUNCTION PANEL
Albumin: 4.4 g/dL (ref 3.5–5.2)
Alkaline Phosphatase: 39 U/L (ref 39–117)

## 2011-08-05 LAB — CBC
MCHC: 33.1 g/dL (ref 30.0–36.0)
RDW: 14.1 % (ref 11.5–14.6)

## 2011-08-05 MED ORDER — NEOMYCIN-POLYMYXIN-HC 3.5-10000-1 OT SOLN: 3.0000 [drp] | Freq: Three times a day (TID) | OTIC | Status: AC

## 2011-08-05 MED ORDER — ESCITALOPRAM OXALATE 10 MG PO TABS: 10.0000 mg | ORAL_TABLET | Freq: Every day | ORAL | Status: DC

## 2011-08-05 MED ORDER — ALPRAZOLAM 0.25 MG PO TABS: 0.2500 mg | ORAL_TABLET | Freq: Two times a day (BID) | ORAL | Status: DC | PRN

## 2011-08-05 NOTE — Progress Notes (Signed)
Patient ID: Marcia Foley, female   DOB: 1980-10-24, 31 y.o.   MRN: [id number]   PE:  CV: RRR, 2/6 systolic murmur  A/P  New onset cardiac murmur, sent for 2d echo to further evaluate

## 2011-08-05 NOTE — Assessment & Plan Note (Signed)
Started on Cortisporin otic for next 10 days and reassess at next visit.

## 2011-08-05 NOTE — Assessment & Plan Note (Addendum)
Fasting labs today, was scheduled for pap today but is experiencing heavy menstrual flow so we will have her return in 6 weeks for pap. Encouraged adequate sleep and healthy diet
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
## 2011-08-05 NOTE — Patient Instructions (Signed)
Otitis Externa  Otitis externa ("swimmer's ear") is a germ (bacterial) or fungal infection of the outer ear canal (from the eardrum to the outside of the ear). Swimming in dirty water may cause swimmer's ear. It also may be caused by moisture in the ear from water remaining after swimming or bathing. Often the first signs of infection may be itching in the ear canal. This may progress to ear canal swelling, redness, and pus drainage, which may be signs of infection.  HOME CARE INSTRUCTIONS    Apply the antibiotic drops to the ear canal as prescribed by your doctor.   This can be a very painful medical condition. A strong pain reliever may be prescribed.   Only take over-the-counter or prescription medicines for pain, discomfort, or fever as directed by your caregiver.   If your caregiver has given you a follow-up appointment, it is very important to keep that appointment. Not keeping the appointment could result in a chronic or permanent injury, pain, hearing loss and disability. If there is any problem keeping the appointment, you must call back to this facility for assistance.  PREVENTION    It is important to keep your ear dry. Use the corner of a towel to wick water out of the ear canal after swimming or bathing.   Avoid scratching in your ear. This can damage the ear canal or remove the protective wax lining the canal and make it easier for germs (bacteria) or a fungus to grow.   You may use ear drops made of rubbing alcohol and vinegar after swimming to prevent future "swimmer's ear" infections. Make up a small bottle of equal parts white vinegar and alcohol. Put 3 or 4 drops into each ear after swimming.   Avoid swimming in lakes, polluted water, or poorly chlorinated pools.  SEEK MEDICAL CARE IF:    An oral temperature above 102 F (38.9 C) develops.   Your ear is still painful after 3 days and shows signs of getting worse (redness, swelling, pain, or pus).  MAKE SURE YOU:    Understand these  instructions.   Will watch your condition.   Will get help right away if you are not doing well or get worse.  Document Released: 12/23/2004 Document Revised: 12/12/2010 Document Reviewed: 07/30/2007  [employer] Patient Information 2012 [employer].
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
## 2011-08-08 ENCOUNTER — Telehealth: Payer: Self-pay

## 2011-08-08 NOTE — Telephone Encounter (Signed)
Patients spouse left a message that he had a couple questions about pts last office visit?  I tried to call pts spouse back. DPR states we can talk to him. Left pts spouse a message to return my call

## 2011-08-11 NOTE — Telephone Encounter (Signed)
Left another message for spouse to return my call

## 2011-08-11 NOTE — Telephone Encounter (Signed)
Pts husband would like to proceed with the Echo.    Nikki ordered the Echo. It is scheduled on 08-13-11 at 11:45.  Lowella Bandy spoke with pts spouse and would like the test to be done on 08-20-11. [facility].

## 2011-08-13 ENCOUNTER — Ambulatory Visit (HOSPITAL_BASED_OUTPATIENT_CLINIC_OR_DEPARTMENT_OTHER): Payer: PRIVATE HEALTH INSURANCE

## 2011-08-20 ENCOUNTER — Ambulatory Visit (HOSPITAL_BASED_OUTPATIENT_CLINIC_OR_DEPARTMENT_OTHER)
Admission: RE | Admit: 2011-08-20 | Discharge: 2011-08-20 | Disposition: A | Discharge status: Home / Self Care | Payer: PRIVATE HEALTH INSURANCE | Source: Ambulatory Visit | Attending: Family Medicine | Admitting: Family Medicine

## 2011-08-20 DIAGNOSIS — F3289 Other specified depressive episodes: Secondary | ICD-10-CM | POA: Insufficient documentation

## 2011-08-20 DIAGNOSIS — N6019 Diffuse cystic mastopathy of unspecified breast: Secondary | ICD-10-CM | POA: Insufficient documentation

## 2011-08-20 DIAGNOSIS — R011 Cardiac murmur, unspecified: Secondary | ICD-10-CM | POA: Insufficient documentation

## 2011-08-20 DIAGNOSIS — R51 Headache: Secondary | ICD-10-CM | POA: Insufficient documentation

## 2011-08-20 DIAGNOSIS — M25569 Pain in unspecified knee: Secondary | ICD-10-CM | POA: Insufficient documentation

## 2011-08-20 DIAGNOSIS — Z87891 Personal history of nicotine dependence: Secondary | ICD-10-CM | POA: Insufficient documentation

## 2011-08-20 DIAGNOSIS — F329 Major depressive disorder, single episode, unspecified: Secondary | ICD-10-CM | POA: Insufficient documentation

## 2011-08-20 DIAGNOSIS — F411 Generalized anxiety disorder: Secondary | ICD-10-CM | POA: Insufficient documentation

## 2011-08-20 NOTE — Progress Notes (Signed)
  Echocardiogram 2D Echocardiogram has been performed.  Marcia Foley 08/20/2011, 9:44 AM

## 2011-08-20 NOTE — Progress Notes (Signed)
Quick Note:  Patients husband informed and voiced understanding ______ 

## 2011-12-02 ENCOUNTER — Encounter: Payer: Self-pay | Admitting: Family Medicine

## 2011-12-02 ENCOUNTER — Ambulatory Visit (INDEPENDENT_AMBULATORY_CARE_PROVIDER_SITE_OTHER): Payer: PRIVATE HEALTH INSURANCE | Admitting: Family Medicine

## 2011-12-02 VITALS — BP 105/70 | HR 93 | Temp 99.0°F | Ht 63.0 in | Wt 145.8 lb

## 2011-12-02 DIAGNOSIS — F341 Dysthymic disorder: Secondary | ICD-10-CM

## 2011-12-02 DIAGNOSIS — H6092 Unspecified otitis externa, left ear: Secondary | ICD-10-CM

## 2011-12-02 DIAGNOSIS — F411 Generalized anxiety disorder: Secondary | ICD-10-CM

## 2011-12-02 DIAGNOSIS — H6690 Otitis media, unspecified, unspecified ear: Secondary | ICD-10-CM

## 2011-12-02 DIAGNOSIS — B09 Unspecified viral infection characterized by skin and mucous membrane lesions: Secondary | ICD-10-CM

## 2011-12-02 DIAGNOSIS — B088 Other specified viral infections characterized by skin and mucous membrane lesions: Secondary | ICD-10-CM

## 2011-12-02 DIAGNOSIS — F419 Anxiety disorder, unspecified: Secondary | ICD-10-CM

## 2011-12-02 DIAGNOSIS — H60399 Other infective otitis externa, unspecified ear: Secondary | ICD-10-CM

## 2011-12-02 DIAGNOSIS — F418 Other specified anxiety disorders: Secondary | ICD-10-CM

## 2011-12-02 HISTORY — DX: Unspecified viral infection characterized by skin and mucous membrane lesions: B09

## 2011-12-02 MED ORDER — ACYCLOVIR 400 MG PO TABS: 400.0000 mg | ORAL_TABLET | Freq: Every day | ORAL | Status: DC

## 2011-12-02 MED ORDER — ESCITALOPRAM OXALATE 10 MG PO TABS: 10.0000 mg | ORAL_TABLET | Freq: Every day | ORAL | Status: AC

## 2011-12-02 MED ORDER — AMOXICILLIN 500 MG PO CAPS: 500.0000 mg | ORAL_CAPSULE | Freq: Three times a day (TID) | ORAL | Status: AC

## 2011-12-02 MED ORDER — ALPRAZOLAM 0.25 MG PO TABS: 0.2500 mg | ORAL_TABLET | Freq: Two times a day (BID) | ORAL | Status: AC | PRN

## 2011-12-02 NOTE — Patient Instructions (Addendum)
Start a probiotic dailyOtitis Media, Adult A middle ear infection is an infection in the space behind the eardrum. The medical name for this is "otitis media." It may happen after a common cold. It is caused by a germ that starts growing in that space. You may feel swollen glands in your neck on the side of the ear infection. HOME CARE INSTRUCTIONS   Take your medicine as directed until it is gone, even if you feel better after the first few days.  Only take over-the-counter or prescription medicines for pain, discomfort, or fever as directed by your caregiver.  Occasional use of a nasal decongestant a couple times per day may help with discomfort and help the eustachian tube to drain better. Follow up with your caregiver in 10 to 14 days or as directed, to be certain that the infection has cleared. Not keeping the appointment could result in a chronic or permanent injury, pain, hearing loss and disability. If there is any problem keeping the appointment, you must call back to this facility for assistance. SEEK IMMEDIATE MEDICAL CARE IF:   You are not getting better in 2 to 3 days.  You have pain that is not controlled with medication.  You feel worse instead of better.  You cannot use the medication as directed.  You develop swelling, redness or pain around the ear or stiffness in your neck. MAKE SURE YOU:   Understand these instructions.  Will watch your condition.  Will get help right away if you are not doing well or get worse. Document Released: 09/28/2003 Document Revised: 03/17/2011 Document Reviewed: 07/30/2007 [facility] Patient Information 2013 [place], [place].   Herpes Labialis You have a fever blister or cold sore (herpes labialis). These painful, grouped sores are caused by one of the herpes viruses (HSV1 most commonly). They are usually found around the lips and mouth, but the same infection can also affect other areas on the face such as the nose and eyes. Herpes  infections take about 10 days to heal. They often occur again and again in the same spot. Other symptoms may include numbness and tingling in the involved skin, achiness, fever, and swollen glands in the neck. Colds, emotional stress, injuries, or excess sunlight exposure all seem to make herpes reappear. Herpes lip infections are contagious. Direct contact with these sores can spread the infection. It can also be spread to other parts of your own body. TREATMENT  Herpes labialis is usually self-limited and resolves within 1 week. To reduce pain and swelling, apply ice packs frequently to the sores or suck on popsicles or frozen juice bars. Antiviral medicine may be used by mouth to shorten the duration of the breakout. Avoid spreading the infection by washing your hands often. Be careful not to touch your eyes or genital areas after handling the infected blisters. Do not kiss or have other intimate contact with others. After the blisters are completely healed you may resume contact. Use sunscreen to lessen recurrences.  If this is your first infection with herpes, or if you have a severe or repeated infections, your caregiver may prescribe one of the anti-viral drugs to speed up the healing. If you have sun-related flare-ups despite the use of sunscreen, starting oral anti-viral medicine before a prolonged exposure (going skiing or to the beach) can prevent most episodes.  SEEK IMMEDIATE MEDICAL CARE IF:  You develop a headache, sleepiness, high fever, vomiting, or severe weakness.  You have eye irritation, pain, blurred vision or redness.  You  develop a prolonged infection not getting better in 10 days. Document Released: 12/23/2004 Document Revised: 03/17/2011 Document Reviewed: 10/27/2008 [facility] Patient Information 2013 [place], [place].

## 2011-12-02 NOTE — Assessment & Plan Note (Signed)
Started Amoxicillin 5500 mg po tid and start a probiotic daily

## 2011-12-02 NOTE — Assessment & Plan Note (Signed)
1 month of intermittent fevers, chills

## 2011-12-02 NOTE — Progress Notes (Signed)
Patient ID: Marcia Foley, female   DOB: 01-09-1980, 31 y.o.   MRN: [id number] Marcia Foley [id number] [birth year] 12/02/2011      Progress Note-Follow Up  Subjective  Chief Complaint  Chief Complaint  Patient presents with  . fever blister  . Otitis Media    HPI  Patient is a 31 year old female who is in today with multiple complaints. She reports she's been ill for about a month now she has intermittent fevers and chills. Malaise and myalgias. She's had nasal congestion and sore throat. Now has mild headache and ear pain. Has had trouble with fever blisters on her lips and most notably on her lower lip as well. Denies chest pain but does have some mild chest congestion. No palpitations or shortness of breath no GI or GU she has tried over-the-counter Relafen and Tylenol with minimal improvement. She stopped her Lexapro in her alprazolam on her own feeling as if her anxiety was better but unfortunately the last 2 weeks her irritability and low mood had worsened. As a result days ago she did restart her Lexapro. Would like to stay on it for the foreseeable future at this time. No suicidal ideation  Past Medical History  Diagnosis Date  . Headache   . Depression   . Tinnitus   . Fibrocystic breast   . Depression with anxiety   . History of venomous snake bite 06/24/2011  . Knee pain, left 06/24/2011  . Preventative health care 08/05/2011  . Otitis externa of left ear   . Viral infection characterized by skin and mucous membrane lesions 12/02/2011    Past Surgical History  Procedure Date  . Appendectomy 31 years old  . Breast surgery     right breast cyst excised. 2009    Family History  Problem Relation Age of Onset  . Hypertension Mother   . Hypertension Father     History   Social History  . Marital Status: Married    Spouse Name: N/A    Number of Children: N/A  . Years of Education: N/A   Occupational History  . Not on file.   Social History Main Topics  . Smoking  status: Former Smoker -- 0.1 packs/day for 2 years    Types: Cigarettes    Quit date: 01/06/2005  . Smokeless tobacco: Not on file  . Alcohol Use: Yes     Comment: glass of wine weekly  . Drug Use: No  . Sexually Active: Yes -- Female partner(s)   Other Topics Concern  . Not on file   Social History Narrative  . No narrative on file    Current Outpatient Prescriptions on File Prior to Visit  Medication Sig Dispense Refill  . aspirin 325 MG tablet Take 325 mg by mouth every 4 (four) hours as needed. For pain      . ibuprofen (ADVIL,MOTRIN) 200 MG tablet Take 400 mg by mouth every 6 (six) hours as needed. For pain      . [DISCONTINUED] escitalopram (LEXAPRO) 10 MG tablet Take 1 tablet (10 mg total) by mouth daily.  90 tablet  3    Allergies  Allergen Reactions  . Zoloft (Sertraline Hcl)     Palpitations, nausea/vomit  . Other     Steroids = keeps her awake    Review of Systems  Review of Systems  Constitutional: Positive for fever and malaise/fatigue.  HENT: Positive for ear pain and congestion.   Eyes: Negative for discharge.  Respiratory: Positive for cough and sputum  production. Negative for shortness of breath.   Cardiovascular: Negative for chest pain, palpitations and leg swelling.  Gastrointestinal: Negative for nausea, abdominal pain and diarrhea.  Genitourinary: Negative for dysuria.  Musculoskeletal: Negative for falls.  Skin: Negative for rash.  Neurological: Positive for headaches. Negative for loss of consciousness.  Endo/Heme/Allergies: Negative for polydipsia.  Psychiatric/Behavioral: Negative for depression and suicidal ideas. The patient is not nervous/anxious and does not have insomnia.     Objective  BP 105/70  Pulse 93  Temp 99 F (37.2 C) (Temporal)  Ht 5\' 3"  (1.6 m)  Wt 145 lb 12.8 oz (66.134 kg)  BMI 25.83 kg/m2  SpO2 97%  Physical Exam  Physical Exam  Constitutional: She is oriented to person, place, and time and well-developed,  well-nourished, and in no distress. No distress.  HENT:  Head: Normocephalic and atraumatic.  Right Ear: External ear normal.       Vesicular lesions on lower lip. Left TM erythematous   Eyes: Conjunctivae normal are normal.  Neck: Neck supple. No thyromegaly present.  Cardiovascular: Normal rate, regular rhythm and normal heart sounds.   No murmur heard. Pulmonary/Chest: Effort normal and breath sounds normal. She has no wheezes.  Abdominal: She exhibits no distension and no mass.  Musculoskeletal: She exhibits no edema.  Lymphadenopathy:    She has cervical adenopathy.  Neurological: She is alert and oriented to person, place, and time.  Skin: Skin is warm and dry. No rash noted. She is not diaphoretic.  Psychiatric: Memory, affect and judgment normal.    Lab Results  Component Value Date   TSH 0.96 08/05/2011   Lab Results  Component Value Date   WBC 4.9 08/05/2011   HGB 12.2 08/05/2011   HCT 37.0 08/05/2011   MCV 88.1 08/05/2011   PLT 328.0 08/05/2011   Lab Results  Component Value Date   CREATININE 0.6 08/05/2011   BUN 12 08/05/2011   NA 139 08/05/2011   K 4.3 08/05/2011   CL 103 08/05/2011   CO2 29 08/05/2011   Lab Results  Component Value Date   ALT 17 08/05/2011   AST 18 08/05/2011   ALKPHOS 39 08/05/2011   BILITOT 0.9 08/05/2011   Lab Results  Component Value Date   CHOL 195 08/05/2011   Lab Results  Component Value Date   HDL 55.40 08/05/2011   Lab Results  Component Value Date   LDLCALC 117* 08/05/2011   Lab Results  Component Value Date   TRIG 114.0 08/05/2011   Lab Results  Component Value Date   CHOLHDL 4 08/05/2011     Assessment & Plan  Viral infection characterized by skin and mucous membrane lesions 1 month of intermittent fevers, chills  Otitis externa of left ear Started Amoxicillin 5500 mg po tid and start a probiotic daily  Depression with anxiety Stopped her Lexapro and Alprazolam on her own but acknowledges she has become increasingly  irritable and low mood. Restarted Lexapro 2 days ago. Given refill today

## 2011-12-02 NOTE — Assessment & Plan Note (Signed)
Stopped her Lexapro and Alprazolam on her own but acknowledges she has become increasingly irritable and low mood. Restarted Lexapro 2 days ago. Given refill today

## 2012-02-09 ENCOUNTER — Other Ambulatory Visit: Payer: Self-pay | Admitting: Family Medicine

## 2012-02-09 DIAGNOSIS — B09 Unspecified viral infection characterized by skin and mucous membrane lesions: Secondary | ICD-10-CM

## 2012-02-09 MED ORDER — ACYCLOVIR 400 MG PO TABS: 400.0000 mg | ORAL_TABLET | Freq: Every day | ORAL | Status: AC

## 2012-02-09 NOTE — Telephone Encounter (Signed)
Simplex 1

## 2012-02-09 NOTE — Telephone Encounter (Signed)
Left a message on pts cell phone stating that we need to know what this medication is used for or what symptoms she is having to be requesting this medication?

## 2012-02-21 ENCOUNTER — Other Ambulatory Visit: Payer: Self-pay

## 2012-03-02 ENCOUNTER — Encounter: Payer: Self-pay | Admitting: Internal Medicine

## 2012-03-02 ENCOUNTER — Ambulatory Visit (INDEPENDENT_AMBULATORY_CARE_PROVIDER_SITE_OTHER): Payer: PRIVATE HEALTH INSURANCE | Admitting: Internal Medicine

## 2012-03-02 VITALS — BP 114/80 | Temp 99.4°F | Wt 150.0 lb

## 2012-03-02 DIAGNOSIS — H9209 Otalgia, unspecified ear: Secondary | ICD-10-CM

## 2012-03-02 DIAGNOSIS — H9202 Otalgia, left ear: Secondary | ICD-10-CM

## 2012-03-02 NOTE — Patient Instructions (Addendum)
Eustachian tube dysfunction Use Mucinex over the counter every 12 hrs Use Allegra-D 12 hrs as needed Please contact our office if your symptoms do not improve or gets worse.

## 2012-03-02 NOTE — Assessment & Plan Note (Signed)
32 year old Asian female presents with left ear pain. On exam, left tympanic membrane slightly retracted. I suspect her symptoms are secondary to possible eustachian tube dysfunction. Patient advised to use over-the-counter Mucinex and use decongestants as directed. Patient advised to call office if symptoms persist or worsen.

## 2012-03-02 NOTE — Progress Notes (Signed)
Subjective:    Patient ID: Marcia Foley, female    DOB: 09-May-1980, 32 y.o.   MRN: [id number]  HPI  32 year old Asian female complains of left ear pain for last 5 days. She has had associated low-grade fever. She also reports intermittent dizziness. Dizziness worse with changes in head position. She complains of generalized achiness and fatigue. She has 40-year-old daughter who has infection. Patient reports 2 previous episodes of possible ear infection the past year.   Review of Systems Low grade fever, no TMJ pain, no hearing loss  Past Medical History  Diagnosis Date  . Headache   . Depression   . Tinnitus   . Fibrocystic breast   . Depression with anxiety   . History of venomous snake bite 06/24/2011  . Knee pain, left 06/24/2011  . Preventative health care 08/05/2011  . Otitis externa of left ear   . Viral infection characterized by skin and mucous membrane lesions 12/02/2011    History   Social History  . Marital Status: Married    Spouse Name: N/A    Number of Children: N/A  . Years of Education: N/A   Occupational History  . Not on file.   Social History Main Topics  . Smoking status: Former Smoker -- 0.10 packs/day for 2 years    Types: Cigarettes    Quit date: 01/06/2005  . Smokeless tobacco: Not on file  . Alcohol Use: Yes     Comment: glass of wine weekly  . Drug Use: No  . Sexually Active: Yes -- Female partner(s)   Other Topics Concern  . Not on file   Social History Narrative  . No narrative on file    Past Surgical History  Procedure Laterality Date  . Appendectomy  31 years old  . Breast surgery      right breast cyst excised. 2009    Family History  Problem Relation Age of Onset  . Hypertension Mother   . Hypertension Father     Allergies  Allergen Reactions  . Zoloft (Sertraline Hcl)     Palpitations, nausea/vomit  . Other     Steroids = keeps her awake    Current Outpatient Prescriptions on File Prior to Visit  Medication Sig  Dispense Refill  . acyclovir (ZOVIRAX) 400 MG tablet Take 1 tablet (400 mg total) by mouth 5 (five) times daily.  20 tablet  1  . ALPRAZolam (XANAX) 0.25 MG tablet Take 1 tablet (0.25 mg total) by mouth 2 (two) times daily as needed for sleep or anxiety.  45 tablet  2  . amoxicillin (AMOXIL) 500 MG capsule Take 1 capsule (500 mg total) by mouth 3 (three) times daily.  30 capsule  0  . aspirin 325 MG tablet Take 325 mg by mouth every 4 (four) hours as needed. For pain      . escitalopram (LEXAPRO) 10 MG tablet Take 1 tablet (10 mg total) by mouth daily.  90 tablet  3  . ibuprofen (ADVIL,MOTRIN) 200 MG tablet Take 400 mg by mouth every 6 (six) hours as needed. For pain       No current facility-administered medications on file prior to visit.    BP 114/80  Temp(Src) 99.4 F (37.4 C) (Oral)  Wt 150 lb (68.04 kg)  BMI 26.58 kg/m2       Objective:   Physical Exam  Constitutional: She is oriented to person, place, and time. She appears well-developed and well-nourished.  HENT:  Head: Normocephalic and atraumatic.  Right Ear: External ear normal.  Left tympanic membrane slightly retracted otherwise unremarkable in appearance  Neck: Neck supple.  Cardiovascular: Normal rate, regular rhythm and normal heart sounds.   Pulmonary/Chest: Effort normal and breath sounds normal. She has no wheezes.  Lymphadenopathy:    She has no cervical adenopathy.  Neurological: She is alert and oriented to person, place, and time. No cranial nerve deficit.  Psychiatric: She has a normal mood and affect. Her behavior is normal.          Assessment & Plan:

## 2012-07-14 ENCOUNTER — Ambulatory Visit (INDEPENDENT_AMBULATORY_CARE_PROVIDER_SITE_OTHER): Payer: BC Managed Care – PPO | Admitting: Family Medicine

## 2012-07-14 ENCOUNTER — Encounter: Payer: Self-pay | Admitting: Family Medicine

## 2012-07-14 VITALS — BP 109/77 | HR 102 | Temp 99.2°F | Resp 16 | Ht 63.0 in | Wt 146.0 lb

## 2012-07-14 DIAGNOSIS — F411 Generalized anxiety disorder: Secondary | ICD-10-CM

## 2012-07-14 DIAGNOSIS — T148 Other injury of unspecified body region: Secondary | ICD-10-CM

## 2012-07-14 DIAGNOSIS — W57XXXA Bitten or stung by nonvenomous insect and other nonvenomous arthropods, initial encounter: Secondary | ICD-10-CM

## 2012-07-14 NOTE — Progress Notes (Addendum)
OFFICE NOTE  07/14/2012  CC:  Chief Complaint  Patient presents with  . Insect Bite     HPI: Patient is a 32 y.o. Asian female who is here for tick bite.   She noted a tick on her body 07/10/12.  Not sure if tick was even implanted in skin or if it was simply crawling on her. She is worried about tick borne disease.  However, when asked about symptoms, she says she has had some but ALL of them were occurring prior to her noticing the tick.  She has noted some HA on and off, tightness in left shoulder sometimes.  Some nausea this morning, without vomiting--this resolved.  Left elbow a bit sore on and off lately.  Itchy at the area where she noted the tick.  No fevers.  No neck stiffness or neck muscle pain.  No rash.  LMP was about 3 wks ago: she is sexually active but doe not think she is pregnant, declines my offer of pregnancy test today.   Pertinent PMH:  Past Medical History  Diagnosis Date  . Headache(784.0)   . Depression   . Tinnitus   . Fibrocystic breast   . Depression with anxiety   . History of venomous snake bite 06/24/2011  . Knee pain, left 06/24/2011  . Preventative health care 08/05/2011  . Otitis externa of left ear   . Viral infection characterized by skin and mucous membrane lesions 12/02/2011    MEDS:  ASA 325mg  po qd, Xanax bid prn, ibuprofen prn  PE: Blood pressure 109/77, pulse 102, temperature 99.2 F (37.3 C), temperature source Temporal, resp. rate 16, height 5\' 3"  (1.6 m), weight 146 lb (66.225 kg), SpO2 98.00%. Gen: Alert, well appearing.  Patient is oriented to person, place, time, and situation. ENT: Ears: EACs clear, normal epithelium.  TMs with good light reflex and landmarks bilaterally.  Eyes: no injection, icteris, swelling, or exudate.  EOMI, PERRLA. Nose: no drainage or turbinate edema/swelling.  No injection or focal lesion.  Mouth: lips without lesion/swelling.  Oral mucosa pink and moist.  Dentition intact and without obvious caries or  gingival swelling.  Oropharynx without erythema, exudate, or swelling.  Neck - No masses or thyromegaly or limitation in range of motion CV: RRR, no m/r/g.   LUNGS: CTA bilat, nonlabored resps, good aeration in all lung fields ABD: soft, NT/ND EXT: no clubbing, cyanosis, or edema.  Skin - no sores or suspicious lesions or rashes or color changes Small nonerythematous papule--barely perceptible--in left upper trapezius area where she found the tick.  IMPRESSION AND PLAN:  Insect bite, healing.  Likely from the tick she found, but pt repeatedly states she is not even sure if the tick was biting her or not.  At one point she stated she was not even positive that what she found on her skin was even a tick.  She also repeatedly stated that "I'm very hypochondriac" and I think she was asking for lots of reassurance. Spent 25 min with pt today, with > 50% of that time spent in counseling regarding her current problem, tick bites, tick borne diseases, etc. Signs/symptoms to call or return for were reviewed and pt expressed understanding.  An After Visit Summary was printed and given to the patient.  FOLLOW UP: prn

## 2012-07-16 ENCOUNTER — Telehealth: Payer: Self-pay | Admitting: Family Medicine

## 2012-07-16 NOTE — Telephone Encounter (Signed)
Dr. Milinda Cave has decided to wait on antibiotic unless patient has more symptoms such as swelling of joints, redness of joints, rash or any muscled aches.  Please advise patient that 70 is not a fever.

## 2012-07-16 NOTE — Telephone Encounter (Signed)
Patient Information:  Caller Name: Racheal  Phone: [phone]  Patient: Marcia Foley, Marcia Foley  Gender: Female  DOB: 16-Nov-1980  Age: 32 Years  PCP: [facility])  Pregnant: No  Office Follow Up:  Does the office need to follow up with this patient?: Yes  Instructions For The Office: Please call and advise if pt needs abx or not for sx that have occurred   Symptoms  Reason For Call & Symptoms: Pt was bit by a tick on 07/09/2012 and was into the office. Pt is calling today having develope a temp of 99(ax) with a headache and aching joints - elbow /both knees and low back that she has had for 2 days. Pt is concerned because she had the tick bite x 24h and the area she got the tick in her Aunt had lyme disease from a tick bite.  Reviewed Health History In EMR: Yes  Reviewed Medications In EMR: Yes  Reviewed Allergies In EMR: Yes  Reviewed Surgeries / Procedures: Yes  Date of Onset of Symptoms: 07/09/2012 OB / GYN:  LMP: 06/24/2012  Guideline(s) Used:  Tick Bite  Disposition Per Guideline:   Go to Office Now  Reason For Disposition Reached:   Fever or severe headache occurs, 2 to 14 days following the bite  Advice Given:  N/A  RN Overrode Recommendation:  Patient Requests Prescription  Pt wanted to update the MD in case abx would be prescribed. Pt uses [facility]

## 2012-07-16 NOTE — Telephone Encounter (Signed)
Dr. Milinda Cave stated to send Doxy 100 mg 1 BID for 7 days to treat general tick born infections.  I tried to call pt and tell her plan but had to leave message.  Waiting to talk to patient before sending medication in to pharmacy.

## 2012-08-12 ENCOUNTER — Encounter: Payer: Self-pay | Admitting: Family Medicine

## 2012-08-12 ENCOUNTER — Ambulatory Visit (INDEPENDENT_AMBULATORY_CARE_PROVIDER_SITE_OTHER): Payer: BC Managed Care – PPO | Admitting: Family Medicine

## 2012-08-12 ENCOUNTER — Ambulatory Visit: Payer: BC Managed Care – PPO | Admitting: Physician Assistant

## 2012-08-12 VITALS — BP 98/76 | Temp 98.9°F | Wt 148.0 lb

## 2012-08-12 DIAGNOSIS — J069 Acute upper respiratory infection, unspecified: Secondary | ICD-10-CM

## 2012-08-12 DIAGNOSIS — W57XXXA Bitten or stung by nonvenomous insect and other nonvenomous arthropods, initial encounter: Secondary | ICD-10-CM

## 2012-08-12 DIAGNOSIS — L0291 Cutaneous abscess, unspecified: Secondary | ICD-10-CM

## 2012-08-12 DIAGNOSIS — L0390 Cellulitis, unspecified: Secondary | ICD-10-CM

## 2012-08-12 DIAGNOSIS — T148 Other injury of unspecified body region: Secondary | ICD-10-CM

## 2012-08-12 MED ORDER — DOXYCYCLINE HYCLATE 100 MG PO TABS: 100.0000 mg | ORAL_TABLET | Freq: Two times a day (BID) | ORAL | Status: DC

## 2012-08-12 MED ORDER — DOXYCYCLINE HYCLATE 100 MG PO CAPS: 100.0000 mg | ORAL_CAPSULE | Freq: Two times a day (BID) | ORAL | Status: AC

## 2012-08-12 NOTE — Progress Notes (Signed)
Chief Complaint  Patient presents with  . Fever    lastnight was 100.6;  started last Saturday; weird bump on body    HPI: 32 yo pt of Dr. Rogelia Foley with PMH sig for depression and anxiety here for:  1)feeling sick for several days: -started 4 days ago -symptoms: low grade fever, highest 100.6, nasal congestion, sore throat, drainage in throat, ear pain -denies: SOB, NVD, sinus pain pain  2)Bug bites: -something bit her yesterday in the house -has not been outside recently -bit by tick over 1 month ago - but had been feeling fine until recently -3 itchy bites one on R arn, 2 on L shoulder - one on R arm is larger -up to date on tetanus   ROS: See pertinent positives and negatives per HPI.  Past Medical History  Diagnosis Date  . Headache(784.0)   . Depression   . Tinnitus   . Fibrocystic breast   . Depression with anxiety   . History of venomous snake bite 06/24/2011  . Knee pain, left 06/24/2011  . Preventative health care 08/05/2011  . Otitis externa of left ear   . Viral infection characterized by skin and mucous membrane lesions 12/02/2011    Family History  Problem Relation Age of Onset  . Hypertension Mother   . Hypertension Father     History   Social History  . Marital Status: Married    Spouse Name: N/A    Number of Children: N/A  . Years of Education: N/A   Social History Main Topics  . Smoking status: Former Smoker -- 0.10 packs/day for 2 years    Types: Cigarettes    Quit date: 01/06/2005  . Smokeless tobacco: None  . Alcohol Use: Yes     Comment: glass of wine weekly  . Drug Use: No  . Sexually Active: Yes -- Female partner(s)   Other Topics Concern  . None   Social History Narrative  . None    Current outpatient prescriptions:acyclovir (ZOVIRAX) 400 MG tablet, Take 1 tablet (400 mg total) by mouth 5 (five) times daily., Disp: 20 tablet, Rfl: 1;  ALPRAZolam (XANAX) 0.25 MG tablet, Take 1 tablet (0.25 mg total) by mouth 2 (two) times daily as  needed for sleep or anxiety., Disp: 45 tablet, Rfl: 2;  aspirin 325 MG tablet, Take 325 mg by mouth every 4 (four) hours as needed. For pain, Disp: , Rfl:  escitalopram (LEXAPRO) 10 MG tablet, Take 1 tablet (10 mg total) by mouth daily., Disp: 90 tablet, Rfl: 3;  ibuprofen (ADVIL,MOTRIN) 200 MG tablet, Take 400 mg by mouth every 6 (six) hours as needed. For pain, Disp: , Rfl: ;  amoxicillin (AMOXIL) 500 MG capsule, Take 1 capsule (500 mg total) by mouth 3 (three) times daily., Disp: 30 capsule, Rfl: 0 doxycycline (VIBRA-TABS) 100 MG tablet, Take 1 tablet (100 mg total) by mouth 2 (two) times daily., Disp: 20 tablet, Rfl: 0  EXAM:  Filed Vitals:   08/12/12 1507  BP: 98/76  Temp: 98.9 F (37.2 C)    Body mass index is 26.22 kg/(m^2).  GENERAL: vitals reviewed and listed above, alert, oriented, appears well hydrated and in no acute distress  HEENT: atraumatic, conjunttiva clear, no obvious abnormalities on inspection of external nose and ears, normal appearance of ear canals and TMs, clear nasal congestion, mild post oropharyngeal erythema with PND, no tonsillar edema or exudate, no sinus TTP  NECK: no obvious masses on inspection  LUNGS: clear to auscultation bilaterally, no wheezes,  rales or rhonchi, good air movement  CV: HRRR, no peripheral edema  MS: moves all extremities without noticeable abnormality  SKIN: 2 insect bites on L shoulder with mild local reaction, one insect bite on R arm with small punctate center, small amount of induration, and larger area of surrounding erythema and warmth in area about 5 cm in diameter  PSYCH: pleasant and cooperative, no obvious depression or anxiety  ASSESSMENT AND PLAN:  Discussed the following assessment and plan:  Upper respiratory infection -likely viral, advised supportive care  Bug bites -supportive care -zyrtec  Cellulitis - Plan: doxycycline (VIBRA-TABS) 100 MG tablet -possible mild cellulitis at site of bug bite on R arm  versus local reaction -will do doxy just in case which also will tx if unlikely but small possibility of bacterial sinusitis - discussed risks and use -return and ed precuations  -Patient advised to return or notify a doctor immediately if symptoms worsen or persist or new concerns arise.  Patient Instructions  For bug bites:  -As we discussed, we have prescribed a new medication (doxycycline) for you at this appointment. We discussed the common and serious potential adverse effects of this medication and you can review these and more with the pharmacist when you pick up your medication.  Please follow the instructions for use carefully and notify us immediately if you have any problems taking this medication.  -zyrtec daily  -if redness getting much worse or spreading up arm see a doctor immediately  INSTRUCTIONS FOR UPPER RESPIRATORY INFECTION:  -plenty of rest and fluids  -nasal saline wash 2-3 times daily (use prepackaged nasal saline or bottled/distilled water if making your own)   -can use sinex nasal spray for drainage and nasal congestion - but do NOT use longer then 3-4 days  -can use tylenol or ibuprofen as directed for aches and sorethroat  -if you are taking a cough medication - use only as directed, may also try a teaspoon of honey to coat the throat and throat lozenges  -for sore throat, salt water gargles can help  -follow up if you have fevers, facial pain, tooth pain, difficulty breathing or are worsening or not getting better in 5-7 days      Marcia Foley R.

## 2012-08-12 NOTE — Patient Instructions (Signed)
For bug bites:  -As we discussed, we have prescribed a new medication (doxycycline) for you at this appointment. We discussed the common and serious potential adverse effects of this medication and you can review these and more with the pharmacist when you pick up your medication.  Please follow the instructions for use carefully and notify us immediately if you have any problems taking this medication.  -zyrtec daily  -if redness getting much worse or spreading up arm see a doctor immediately  INSTRUCTIONS FOR UPPER RESPIRATORY INFECTION:  -plenty of rest and fluids  -nasal saline wash 2-3 times daily (use prepackaged nasal saline or bottled/distilled water if making your own)   -can use sinex nasal spray for drainage and nasal congestion - but do NOT use longer then 3-4 days  -can use tylenol or ibuprofen as directed for aches and sorethroat  -if you are taking a cough medication - use only as directed, may also try a teaspoon of honey to coat the throat and throat lozenges  -for sore throat, salt water gargles can help  -follow up if you have fevers, facial pain, tooth pain, difficulty breathing or are worsening or not getting better in 5-7 days

## 2012-08-12 NOTE — Addendum Note (Signed)
Addended by: Azucena Freed on: 08/12/2012 04:17 PM   Modules accepted: Orders, Medications

## 2012-10-05 ENCOUNTER — Telehealth: Payer: Self-pay | Admitting: *Deleted

## 2012-10-05 NOTE — Telephone Encounter (Signed)
So she can have a small refill on Alprazolam til she is able to get back in here. She has to be seen every 6-7 mnths to continue this med so schedule her an appt and give her just #20 Alprazolam to hold her over til seen.

## 2012-10-05 NOTE — Telephone Encounter (Signed)
Faxed refill request received from pharmacy for Alprazolam 0.25 mg Last filled by MD on 11.26.2013 Last AEX - 11.26.13, #[id number] Next AEX - No future appt scheduled; patient has been seeing Dr. Artist Pais & Dr. Selena Batten at [facility] since last visit. Please Advise/SLS

## 2012-10-06 NOTE — Telephone Encounter (Signed)
Alprazolam was called in to [facility] pharmacy. Pt notified and aware that she needs to schedule an appt for additional refills. States she will call back to schedule.

## 2012-11-11 ENCOUNTER — Other Ambulatory Visit: Payer: Self-pay
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
## 2015-02-22 ENCOUNTER — Other Ambulatory Visit: Payer: Self-pay | Admitting: Family Medicine

## 2015-02-22 ENCOUNTER — Other Ambulatory Visit (HOSPITAL_COMMUNITY)
Admission: RE | Admit: 2015-02-22 | Discharge: 2015-02-22 | Disposition: A | Discharge status: Home / Self Care | Payer: 59 | Source: Ambulatory Visit | Attending: Family Medicine | Admitting: Family Medicine

## 2015-02-22 DIAGNOSIS — Z1151 Encounter for screening for human papillomavirus (HPV): Secondary | ICD-10-CM | POA: Insufficient documentation

## 2015-02-22 DIAGNOSIS — Z124 Encounter for screening for malignant neoplasm of cervix: Secondary | ICD-10-CM | POA: Diagnosis present

## 2015-02-26 LAB — CYTOLOGY - PAP

## 2016-03-05 DIAGNOSIS — D649 Anemia, unspecified: Secondary | ICD-10-CM | POA: Diagnosis not present

## 2016-03-05 DIAGNOSIS — Z131 Encounter for screening for diabetes mellitus: Secondary | ICD-10-CM | POA: Diagnosis not present

## 2016-03-05 DIAGNOSIS — Z0000 Encounter for general adult medical examination without abnormal findings: Secondary | ICD-10-CM | POA: Diagnosis not present

## 2016-03-05 DIAGNOSIS — Z13220 Encounter for screening for lipoid disorders: Secondary | ICD-10-CM | POA: Diagnosis not present

## 2016-10-12 ENCOUNTER — Emergency Department (HOSPITAL_COMMUNITY)
Admission: EM | Admit: 2016-10-12 | Discharge: 2016-10-12 | Disposition: A | Discharge status: Home / Self Care | Payer: 59 | Attending: Emergency Medicine | Admitting: Emergency Medicine

## 2016-10-12 DIAGNOSIS — R21 Rash and other nonspecific skin eruption: Secondary | ICD-10-CM | POA: Diagnosis present

## 2016-10-12 DIAGNOSIS — S40871A Other superficial bite of right upper arm, initial encounter: Secondary | ICD-10-CM | POA: Diagnosis not present

## 2016-10-12 DIAGNOSIS — R55 Syncope and collapse: Secondary | ICD-10-CM | POA: Diagnosis not present

## 2016-10-12 DIAGNOSIS — L509 Urticaria, unspecified: Secondary | ICD-10-CM | POA: Diagnosis not present

## 2016-10-12 DIAGNOSIS — W57XXXA Bitten or stung by nonvenomous insect and other nonvenomous arthropods, initial encounter: Secondary | ICD-10-CM | POA: Diagnosis not present

## 2016-10-12 DIAGNOSIS — Z79899 Other long term (current) drug therapy: Secondary | ICD-10-CM | POA: Diagnosis not present

## 2016-10-12 DIAGNOSIS — Z87891 Personal history of nicotine dependence: Secondary | ICD-10-CM | POA: Diagnosis not present

## 2016-10-12 DIAGNOSIS — R069 Unspecified abnormalities of breathing: Secondary | ICD-10-CM | POA: Diagnosis not present

## 2016-10-12 DIAGNOSIS — T7840XA Allergy, unspecified, initial encounter: Secondary | ICD-10-CM | POA: Diagnosis not present

## 2016-10-12 LAB — CBC
HCT: 39 % (ref 36.0–46.0)
Hemoglobin: 13 g/dL (ref 12.0–15.0)
MCH: 29.3 pg (ref 26.0–34.0)
MCHC: 33.3 g/dL (ref 30.0–36.0)
MCV: 87.8 fL (ref 78.0–100.0)
PLATELETS: 329 10*3/uL (ref 150–400)
RBC: 4.44 MIL/uL (ref 3.87–5.11)
RDW: 14.3 % (ref 11.5–15.5)
WBC: 10.8 10*3/uL — ABNORMAL HIGH (ref 4.0–10.5)

## 2016-10-12 LAB — BASIC METABOLIC PANEL
Anion gap: 11 (ref 5–15)
BUN: 10 mg/dL (ref 6–20)
CALCIUM: 8.8 mg/dL — AB (ref 8.9–10.3)
CHLORIDE: 105 mmol/L (ref 101–111)
CO2: 22 mmol/L (ref 22–32)
CREATININE: 0.77 mg/dL (ref 0.44–1.00)
GFR calc Af Amer: >60 mL/min (ref >60)
GFR calc non Af Amer: >60 mL/min (ref >60)
GLUCOSE: 138 mg/dL — AB (ref 65–99)
Potassium: 3.4 mmol/L — ABNORMAL LOW (ref 3.5–5.1)
SODIUM: 138 mmol/L (ref 135–145)

## 2016-10-12 LAB — I-STAT BETA HCG BLOOD, ED (MC, WL, AP ONLY): I-stat hCG, quantitative: <5 m[IU]/mL (ref <5)

## 2016-10-12 MED ORDER — PREDNISONE 10 MG PO TABS: 40.0000 mg | ORAL_TABLET | Freq: Every day | ORAL | 0 refills | Status: AC

## 2016-10-12 MED ORDER — FAMOTIDINE 20 MG PO TABS: 20.0000 mg | ORAL_TABLET | Freq: Two times a day (BID) | ORAL | 0 refills | Status: AC

## 2016-10-12 MED ORDER — DIPHENHYDRAMINE HCL 25 MG PO TABS: 25.0000 mg | ORAL_TABLET | Freq: Four times a day (QID) | ORAL | 0 refills | Status: AC

## 2016-10-12 NOTE — ED Triage Notes (Signed)
Pt arrived via [facility] after presenting to [facility] clinic for possible allergic reaction. Per EMS, pt was given 0.4 Epi,  Depo medrol, 8 mg decadron prior to their arrival on scene. Pt stated she was having tingling to both arms and SOB. and felt as thought she was "going to pass out" while waiting to be seen. Per EMS, pt has welt on left forearm and has long list of allergies. EMS VS 143/82, HR 130, 20 RR, 100% room air. EMS stated pt was on 2LPM oxygen at clinic but remained at 100% after being removed from oxygen.  Pt is alert and oriented x4.

## 2016-10-12 NOTE — ED Notes (Signed)
Pt ambulated in hallway to bathroom with no issues.

## 2016-10-12 NOTE — ED Provider Notes (Signed)
[facility]-EMERGENCY DEPT Provider Note   CSN: [id number] Arrival date & time: 10/12/16  1227     History   Chief Complaint Chief Complaint  Patient presents with  . Allergic Reaction    HPI Marcia Foley is a 36 y.o. female.   Patient brought in by EMS from [facility] for possible allergic reaction. Patient was given 0.4 therapy. Patient given at 80 mg of depth Depo-Medrol and/or perhaps 8 mg of Decadron. Patient did not have an IV though. Patient also had received EpiPen. As stated above for the epinephrine. Patient felt as if she was pass out. She started to get tingling in both arms felt short of breath. Then she broke out with hives on her left forearm. 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 Patient upon arrival here was feeling much better. 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 Patient had been somewhat fill feeling since that time and had pain in her left arm since that time. Not clear what the bite was from. Suspected to be an insect of some type.      Past Medical History:  Diagnosis Date  . Depression   . Depression with anxiety   . Fibrocystic breast   . Headache(784.0)   . History of venomous snake bite 06/24/2011  . Knee pain, left 06/24/2011  . Otitis externa of left ear   . Preventative health care 08/05/2011  . Tinnitus   . Viral infection characterized by skin and mucous membrane lesions 12/02/2011    Patient Active Problem List   Diagnosis Date Noted  . Left ear pain 03/02/2012  . Viral infection characterized by skin and mucous membrane lesions 12/02/2011  . Preventative health care 08/05/2011  . History of venomous snake bite 06/24/2011  . Knee pain, left 06/24/2011  . Headache(784.0)   . Depression with anxiety   . Fibrocystic breast     Past Surgical History:  Procedure Laterality Date  . APPENDECTOMY  36 years old  .  BREAST SURGERY     right breast cyst excised. 2009    OB History    No data available       Home Medications    Prior to Admission medications   Medication Sig Start Date End Date Taking? Authorizing Provider  acyclovir (ZOVIRAX) 400 MG tablet Take 1 tablet (400 mg total) by mouth 5 (five) times daily. 02/09/12   Bradd Canary, MD  ALPRAZolam Prudy Feeler) 0.25 MG tablet Take 1 tablet (0.25 mg total) by mouth 2 (two) times daily as needed for sleep or anxiety. 12/02/11   Bradd Canary, MD  amoxicillin (AMOXIL) 500 MG capsule Take 1 capsule (500 mg total) by mouth 3 (three) times daily. 12/02/11   Bradd Canary, MD  aspirin 325 MG tablet Take 325 mg by mouth every 4 (four) hours as needed. For pain    [provider]  diphenhydrAMINE (BENADRYL) 25 MG tablet Take 1 tablet (25 mg total) by mouth every 6 (six) hours. 10/12/16   Vanetta Mulders, MD  doxycycline (VIBRAMYCIN) 100 MG capsule Take 1 capsule (100 mg total) by mouth 2 (two) times daily. 08/12/12   Terressa Koyanagi, DO  escitalopram (LEXAPRO) 10 MG tablet Take 1 tablet (10 mg total) by mouth daily. 12/02/11   Bradd Canary, MD  famotidine (PEPCID) 20 MG tablet Take 1 tablet (20 mg total)  by mouth 2 (two) times daily. 10/12/16   Vanetta Mulders, MD  ibuprofen (ADVIL,MOTRIN) 200 MG tablet Take 400 mg by mouth every 6 (six) hours as needed. For pain    [provider]  predniSONE (DELTASONE) 10 MG tablet Take 4 tablets (40 mg total) by mouth daily. 10/12/16   Vanetta Mulders, MD    Family History Family History  Problem Relation Age of Onset  . Hypertension Mother   . Hypertension Father     Social History Social History  Substance Use Topics  . Smoking status: Former Smoker    Packs/day: 0.10    Years: 2.00    Types: Cigarettes    Quit date: 01/06/2005  . Smokeless tobacco: Not on file  . Alcohol use Yes     Comment: glass of wine weekly     Allergies   Zoloft [sertraline hcl]; Lactulose; and  Other   Review of Systems Review of Systems  Constitutional: Positive for fatigue. Negative for fever.  HENT: Negative for congestion.   Eyes: Negative for redness.  Respiratory: Positive for shortness of breath.   Cardiovascular: Negative for chest pain.  Gastrointestinal: Negative for abdominal pain.  Genitourinary: Negative for dysuria.  Musculoskeletal: Negative for back pain.  Skin: Positive for rash and wound.  Allergic/Immunologic: Negative for immunocompromised state.  Neurological: Positive for numbness. Negative for syncope, weakness and headaches.  Hematological: Does not bruise/bleed easily.  Psychiatric/Behavioral: Negative for confusion.     Physical Exam Updated Vital Signs BP 118/75   Pulse (!) 103   Temp 97.9 F (36.6 C) (Oral)   Resp 12   Ht 1.63 m (5' 4.17")   Wt 57 kg (125 lb 10.6 oz)   SpO2 100%   BMI 21.45 kg/m   Physical Exam  Constitutional: She is oriented to person, place, and time. She appears well-developed and well-nourished. No distress.  HENT:  Head: Normocephalic and atraumatic.  Mouth/Throat: Oropharynx is clear and moist.  Eyes: Pupils are equal, round, and reactive to light. Conjunctivae and EOM are normal.  Neck: Normal range of motion. Neck supple.  Cardiovascular: Regular rhythm and normal heart sounds.   Pulmonary/Chest: Effort normal and breath sounds normal.  Abdominal: Soft. Bowel sounds are normal. There is no tenderness.  Musculoskeletal: Normal range of motion. She exhibits tenderness. She exhibits no edema.  Patient with an area of erythema circular in nature measuring about 2 cm with a central area of the some eschar. Not black in nature. No deep ulcer. Good radial pulse 2+. Good range of motion at wrist elbow and shoulder. No hives  Neurological: She is alert and oriented to person, place, and time. No cranial nerve deficit or sensory deficit. She exhibits normal muscle tone. Coordination normal.  Skin: Skin is warm. No  rash noted.  No rash currently hives have resolved.  Nursing note and vitals reviewed.    ED Treatments / Results  Labs (all labs ordered are listed, but only abnormal results are displayed) Labs Reviewed  CBC - Abnormal; Notable for the following:       Result Value   WBC 10.8 (*)    All other components within normal limits  BASIC METABOLIC PANEL - Abnormal; Notable for the following:    Potassium 3.4 (*)    Glucose, Bld 138 (*)    Calcium 8.8 (*)    All other components within normal limits  I-STAT BETA HCG BLOOD, ED ([facility], [facility], [facility] ONLY)    EKG  EKG Interpretation None  Cardiac monitoring without any arrhythmias.  Radiology No results found.  Procedures Procedures (including critical care time)  Medications Ordered in ED Medications - No data to display   Initial Impression / Assessment and Plan / ED Course  I have reviewed the triage vital signs and the nursing notes.  Pertinent labs & imaging results that were available during my care of the patient were reviewed by me and considered in my medical decision making (see chart for details).    Patient upon arrival here feeling much better. Able to ambulate. Does not feel like she was a pass out. Not clear exactly what all she got from EMS because she arrived without an IV. Clearly she did get epinephrine. Cardiac monitoring did show some tachycardia in the low 100s. Which resolved. Patient's wound to her left wrist area very well could be a spider bite. Possible at some of her symptoms may have been a mild envenomation from black widow spider. Could be from another type of spider as well. No suggestion of any necrotic tissue at this time. Patient will be continued to be treated as if there was a allergic reaction component. She'll continue prednisone continue Pepcid and will continue Benadryl for the next 24 hours. She will return for any new or worse symptoms. Patient states she feels much better.  The lesion to  the left wrist does not seem to be consistent with erythema migrans. There was no history of any tick on her.  Patient's labs without any significant abnormalities. Pregnancy test negative.   Final Clinical Impressions(s) / ED Diagnoses   Final diagnoses:  Hives  Near syncope  Bug bite, initial encounter    New Prescriptions New Prescriptions   DIPHENHYDRAMINE (BENADRYL) 25 MG TABLET    Take 1 tablet (25 mg total) by mouth every 6 (six) hours.   FAMOTIDINE (PEPCID) 20 MG TABLET    Take 1 tablet (20 mg total) by mouth 2 (two) times daily.   PREDNISONE (DELTASONE) 10 MG TABLET    Take 4 tablets (40 mg total) by mouth daily.     Vanetta Mulders, MD 10/12/16 [phone]

## 2016-10-12 NOTE — Discharge Instructions (Signed)
Labs here today without any significant abnormalities. Due to the hives that developed would recommend continuing prednisone for the next 5 days and Pepcid for the next 7 days. And Benadryl for the next 24 hours. Return for any new or worse symptoms follow-up with your doctor as needed. As we discussed part of the symptoms could be related to the bug bite to the left wrist which could've been a black widow spider. Also some of the symptoms could be related to an allergic reaction.

## 2016-10-15 DIAGNOSIS — T63311A Toxic effect of venom of black widow spider, accidental (unintentional), initial encounter: Secondary | ICD-10-CM | POA: Diagnosis not present

## 2016-12-02 DIAGNOSIS — Z23 Encounter for immunization: Secondary | ICD-10-CM | POA: Diagnosis not present

## 2017-03-06 DIAGNOSIS — Z13220 Encounter for screening for lipoid disorders: Secondary | ICD-10-CM | POA: Diagnosis not present

## 2017-03-06 DIAGNOSIS — Z0000 Encounter for general adult medical examination without abnormal findings: Secondary | ICD-10-CM | POA: Diagnosis not present

## 2017-09-25 DIAGNOSIS — J0100 Acute maxillary sinusitis, unspecified: Secondary | ICD-10-CM | POA: Diagnosis not present

## 2017-11-06 DIAGNOSIS — J309 Allergic rhinitis, unspecified: Secondary | ICD-10-CM | POA: Diagnosis not present

## 2017-11-06 DIAGNOSIS — J3089 Other allergic rhinitis: Secondary | ICD-10-CM | POA: Diagnosis not present

## 2017-11-06 DIAGNOSIS — T783XXA Angioneurotic edema, initial encounter: Secondary | ICD-10-CM | POA: Diagnosis not present
